# Patient Record
Sex: Female | Born: 1935 | Race: White | Hispanic: No | Marital: Single | Smoking: Never smoker
Health system: Southern US, Community
[De-identification: ages and names within clinical notes are randomized; demographics above are authoritative.]

## PROBLEM LIST (undated history)

## (undated) DIAGNOSIS — E119 Type 2 diabetes mellitus without complications: Secondary | ICD-10-CM

## (undated) DIAGNOSIS — I509 Heart failure, unspecified: Secondary | ICD-10-CM

## (undated) DIAGNOSIS — K429 Umbilical hernia without obstruction or gangrene: Secondary | ICD-10-CM

## (undated) DIAGNOSIS — C50919 Malignant neoplasm of unspecified site of unspecified female breast: Secondary | ICD-10-CM

## (undated) DIAGNOSIS — C9000 Multiple myeloma not having achieved remission: Secondary | ICD-10-CM

## (undated) DIAGNOSIS — I10 Essential (primary) hypertension: Secondary | ICD-10-CM

## (undated) DIAGNOSIS — C801 Malignant (primary) neoplasm, unspecified: Secondary | ICD-10-CM

## (undated) DIAGNOSIS — I4891 Unspecified atrial fibrillation: Secondary | ICD-10-CM

---

## 1998-08-30 HISTORY — PX: OTHER SURGICAL HISTORY: SHX169

## 2014-06-13 ENCOUNTER — Emergency Department: Payer: Self-pay | Admitting: Emergency Medicine

## 2014-09-13 ENCOUNTER — Emergency Department: Payer: Self-pay | Admitting: Emergency Medicine

## 2014-09-13 LAB — URINALYSIS, COMPLETE
BILIRUBIN, UR: NEGATIVE
BLOOD: NEGATIVE
Bacteria: NONE SEEN
GLUCOSE, UR: NEGATIVE mg/dL (ref 0–75)
Leukocyte Esterase: NEGATIVE
Nitrite: NEGATIVE
PH: 7 (ref 4.5–8.0)
Protein: NEGATIVE
RBC,UR: <1 /HPF (ref 0–5)
SPECIFIC GRAVITY: 1.011 (ref 1.003–1.030)
SQUAMOUS EPITHELIAL: NONE SEEN
WBC UR: NONE SEEN /HPF (ref 0–5)

## 2014-09-13 LAB — COMPREHENSIVE METABOLIC PANEL
ALT: 20 U/L
Albumin: 3.5 g/dL (ref 3.4–5.0)
Alkaline Phosphatase: 133 U/L — ABNORMAL HIGH
Anion Gap: 6 — ABNORMAL LOW (ref 7–16)
BUN: 8 mg/dL (ref 7–18)
Bilirubin,Total: 0.6 mg/dL (ref 0.2–1.0)
CO2: 28 mmol/L (ref 21–32)
Calcium, Total: 8.7 mg/dL (ref 8.5–10.1)
Chloride: 102 mmol/L (ref 98–107)
Creatinine: 0.55 mg/dL — ABNORMAL LOW (ref 0.60–1.30)
EGFR (Non-African Amer.): >60
Glucose: 133 mg/dL — ABNORMAL HIGH (ref 65–99)
Osmolality: 272 (ref 275–301)
Potassium: 3.6 mmol/L (ref 3.5–5.1)
SGOT(AST): 27 U/L (ref 15–37)
Sodium: 136 mmol/L (ref 136–145)
TOTAL PROTEIN: 8.2 g/dL (ref 6.4–8.2)

## 2014-09-13 LAB — CBC WITH DIFFERENTIAL/PLATELET
BASOS PCT: 0.5 %
Basophil #: 0 10*3/uL (ref 0.0–0.1)
EOS ABS: 0.1 10*3/uL (ref 0.0–0.7)
Eosinophil %: 1.1 %
HCT: 36.5 % (ref 35.0–47.0)
HGB: 11.8 g/dL — ABNORMAL LOW (ref 12.0–16.0)
LYMPHS PCT: 16.6 %
Lymphocyte #: 1.1 10*3/uL (ref 1.0–3.6)
MCH: 32.2 pg (ref 26.0–34.0)
MCHC: 32.4 g/dL (ref 32.0–36.0)
MCV: 99 fL (ref 80–100)
MONO ABS: 0.7 x10 3/mm (ref 0.2–0.9)
Monocyte %: 10 %
Neutrophil #: 4.7 10*3/uL (ref 1.4–6.5)
Neutrophil %: 71.8 %
Platelet: 320 10*3/uL (ref 150–440)
RBC: 3.67 10*6/uL — ABNORMAL LOW (ref 3.80–5.20)
RDW: 16.8 % — ABNORMAL HIGH (ref 11.5–14.5)
WBC: 6.6 10*3/uL (ref 3.6–11.0)

## 2014-09-13 LAB — TROPONIN I: Troponin-I: <0.02 ng/mL

## 2014-09-18 LAB — CULTURE, BLOOD (SINGLE)

## 2016-09-03 ENCOUNTER — Other Ambulatory Visit: Payer: Self-pay | Admitting: Physician Assistant

## 2016-09-03 ENCOUNTER — Ambulatory Visit
Admission: RE | Admit: 2016-09-03 | Discharge: 2016-09-03 | Disposition: A | Discharge status: Home / Self Care | Payer: Medicare Other | Source: Ambulatory Visit | Attending: Physician Assistant | Admitting: Physician Assistant

## 2016-09-03 DIAGNOSIS — K420 Umbilical hernia with obstruction, without gangrene: Secondary | ICD-10-CM

## 2016-09-03 DIAGNOSIS — K429 Umbilical hernia without obstruction or gangrene: Secondary | ICD-10-CM | POA: Diagnosis not present

## 2016-09-03 DIAGNOSIS — I2510 Atherosclerotic heart disease of native coronary artery without angina pectoris: Secondary | ICD-10-CM | POA: Insufficient documentation

## 2016-09-03 DIAGNOSIS — I700 Atherosclerosis of aorta: Secondary | ICD-10-CM | POA: Diagnosis not present

## 2016-09-03 DIAGNOSIS — K5730 Diverticulosis of large intestine without perforation or abscess without bleeding: Secondary | ICD-10-CM | POA: Diagnosis not present

## 2016-09-03 HISTORY — DX: Essential (primary) hypertension: I10

## 2016-09-03 HISTORY — DX: Type 2 diabetes mellitus without complications: E11.9

## 2016-09-03 HISTORY — DX: Malignant (primary) neoplasm, unspecified: C80.1

## 2016-09-03 LAB — POCT I-STAT CREATININE: CREATININE: 0.6 mg/dL (ref 0.44–1.00)

## 2016-09-03 MED ORDER — IOPAMIDOL (ISOVUE-300) INJECTION 61%
100.0000 mL | Freq: Once | INTRAVENOUS | Status: AC | PRN
  Administered 2016-09-03: 100 mL via INTRAVENOUS

## 2016-11-08 ENCOUNTER — Emergency Department: Payer: Medicare Other

## 2016-11-08 ENCOUNTER — Encounter: Payer: Self-pay | Admitting: *Deleted

## 2016-11-08 ENCOUNTER — Emergency Department
Admission: EM | Admit: 2016-11-08 | Discharge: 2016-11-08 | Disposition: A | Discharge status: Home / Self Care | Payer: Medicare Other | Attending: Emergency Medicine | Admitting: Emergency Medicine

## 2016-11-08 DIAGNOSIS — R8281 Pyuria: Secondary | ICD-10-CM

## 2016-11-08 DIAGNOSIS — R42 Dizziness and giddiness: Secondary | ICD-10-CM

## 2016-11-08 DIAGNOSIS — I10 Essential (primary) hypertension: Secondary | ICD-10-CM | POA: Diagnosis not present

## 2016-11-08 DIAGNOSIS — E119 Type 2 diabetes mellitus without complications: Secondary | ICD-10-CM | POA: Insufficient documentation

## 2016-11-08 DIAGNOSIS — Z79899 Other long term (current) drug therapy: Secondary | ICD-10-CM | POA: Diagnosis not present

## 2016-11-08 DIAGNOSIS — R0602 Shortness of breath: Secondary | ICD-10-CM | POA: Insufficient documentation

## 2016-11-08 DIAGNOSIS — N390 Urinary tract infection, site not specified: Secondary | ICD-10-CM | POA: Diagnosis not present

## 2016-11-08 DIAGNOSIS — Z7984 Long term (current) use of oral hypoglycemic drugs: Secondary | ICD-10-CM | POA: Insufficient documentation

## 2016-11-08 HISTORY — DX: Unspecified atrial fibrillation: I48.91

## 2016-11-08 LAB — CBC WITH DIFFERENTIAL/PLATELET
BASOS ABS: 0 10*3/uL (ref 0–0.1)
Basophils Relative: 1 %
EOS PCT: 1 %
Eosinophils Absolute: 0.1 10*3/uL (ref 0–0.7)
HEMATOCRIT: 33.9 % — AB (ref 35.0–47.0)
HEMOGLOBIN: 11.3 g/dL — AB (ref 12.0–16.0)
LYMPHS PCT: 17 %
Lymphs Abs: 0.9 10*3/uL — ABNORMAL LOW (ref 1.0–3.6)
MCH: 33.3 pg (ref 26.0–34.0)
MCHC: 33.3 g/dL (ref 32.0–36.0)
MCV: 100.1 fL — AB (ref 80.0–100.0)
Monocytes Absolute: 0.5 10*3/uL (ref 0.2–0.9)
Monocytes Relative: 10 %
NEUTROS ABS: 3.7 10*3/uL (ref 1.4–6.5)
Neutrophils Relative %: 71 %
Platelets: 232 10*3/uL (ref 150–440)
RBC: 3.39 MIL/uL — AB (ref 3.80–5.20)
RDW: 16.9 % — ABNORMAL HIGH (ref 11.5–14.5)
WBC: 5.2 10*3/uL (ref 3.6–11.0)

## 2016-11-08 LAB — URINALYSIS, COMPLETE (UACMP) WITH MICROSCOPIC
BILIRUBIN URINE: NEGATIVE
Glucose, UA: NEGATIVE mg/dL
HGB URINE DIPSTICK: NEGATIVE
Ketones, ur: NEGATIVE mg/dL
NITRITE: NEGATIVE
PROTEIN: NEGATIVE mg/dL
SPECIFIC GRAVITY, URINE: 1.017 (ref 1.005–1.030)
pH: 6 (ref 5.0–8.0)

## 2016-11-08 LAB — COMPREHENSIVE METABOLIC PANEL
ALK PHOS: 69 U/L (ref 38–126)
ALT: 13 U/L — AB (ref 14–54)
AST: 20 U/L (ref 15–41)
Albumin: 3.7 g/dL (ref 3.5–5.0)
Anion gap: 5 (ref 5–15)
BILIRUBIN TOTAL: 0.8 mg/dL (ref 0.3–1.2)
BUN: 17 mg/dL (ref 6–20)
CALCIUM: 8.9 mg/dL (ref 8.9–10.3)
CHLORIDE: 104 mmol/L (ref 101–111)
CO2: 29 mmol/L (ref 22–32)
CREATININE: 0.33 mg/dL — AB (ref 0.44–1.00)
Glucose, Bld: 147 mg/dL — ABNORMAL HIGH (ref 65–99)
Potassium: 3.8 mmol/L (ref 3.5–5.1)
Sodium: 138 mmol/L (ref 135–145)
Total Protein: 7.5 g/dL (ref 6.5–8.1)

## 2016-11-08 LAB — PROTIME-INR
INR: 1.23
PROTHROMBIN TIME: 15.6 s — AB (ref 11.4–15.2)

## 2016-11-08 LAB — TROPONIN I

## 2016-11-08 MED ORDER — CEPHALEXIN 500 MG PO CAPS
500.0000 mg | ORAL_CAPSULE | Freq: Once | ORAL | Status: AC
  Administered 2016-11-08: 500 mg via ORAL
  Filled 2016-11-08: qty 1

## 2016-11-08 MED ORDER — CEPHALEXIN 500 MG PO CAPS: 500.0000 mg | ORAL_CAPSULE | Freq: Three times a day (TID) | ORAL | 0 refills | Status: AC

## 2016-11-08 MED ORDER — SODIUM CHLORIDE 0.9 % IV BOLUS (SEPSIS)
1000.0000 mL | Freq: Once | INTRAVENOUS | Status: AC
  Administered 2016-11-08: 1000 mL via INTRAVENOUS

## 2016-11-08 NOTE — ED Provider Notes (Signed)
[facility] Emergency Department Provider Note  ____________________________________________   First MD Initiated Contact with Patient 11/08/16 [phone]     (approximate)  I have reviewed the triage vital signs and the nursing notes.   HISTORY  Chief Complaint Dizziness   HPI Cassie Huffman is a 83 y.o. female who comes to the emergency department via EMS for lightheadedness and a near syncopal episode today. The last 2-3 days she has felt more lightheaded in this morning she stood up to go to the bathroom and she felt off balance and fell to the ground onto her left side. She did not hit her head. She denies loss of consciousness. She denies chest pain or shortness of breath. She denies palpitations. She has a long-standing history of atrial fibrillation and last year she had a "small stroke". And is now currently anticoagulated with*all toe.   Past Medical History:  Diagnosis Date  . A-fib ([place])   . Cancer ([place])   . Diabetes mellitus without complication ([place])   . Hypertension     There are no active problems to display for this patient.   History reviewed. No pertinent surgical history.  Prior to Admission medications   Medication Sig Start Date End Date Taking? Authorizing Provider  diclofenac (FLECTOR) 1.3 % PTCH Place 1 patch onto the skin every 12 (twelve) hours as needed. 10/27/16 11/26/16 Yes Historical Provider, MD  cephALEXin (KEFLEX) 500 MG capsule Take 1 capsule (500 mg total) by mouth 3 (three) times daily. 11/08/16 11/13/16  Darel Hong, MD  metoprolol succinate (TOPROL-XL) 25 MG 24 hr tablet Take 25 mg by mouth daily. 10/26/16   Historical Provider, MD  WELCHOL 625 MG tablet Take 625 mg by mouth 2 (two) times daily. 10/05/16   Historical Provider, MD  XARELTO 15 MG TABS tablet Take 15 mg by mouth daily. 11/04/16   Historical Provider, MD    Allergies Patient has no known allergies.  No family history on file.  Social History Social  History  Substance Use Topics  . Smoking status: Never Smoker  . Smokeless tobacco: Never Used  . Alcohol use No    Review of Systems Constitutional: No fever/chills Eyes: No visual changes. ENT: No sore throat. Cardiovascular: Denies chest pain. Respiratory: Denies shortness of breath. Gastrointestinal: No abdominal pain.  No nausea, no vomiting.  No diarrhea.  No constipation. Genitourinary: Negative for dysuria. Musculoskeletal: Negative for back pain. Skin: Negative for rash. Neurological: Negative for headaches, focal weakness or numbness.  10-point ROS otherwise negative.  ____________________________________________   PHYSICAL EXAM:  VITAL SIGNS: ED Triage Vitals  Enc Vitals Group     BP 11/08/16 0743 (!) 153/92     Pulse Rate 11/08/16 0743 77     Resp 11/08/16 0743 18     Temp 11/08/16 0743 97.7 F (36.5 C)     Temp Source 11/08/16 0743 Oral     SpO2 11/08/16 0740 98 %     Weight 11/08/16 0743 196 lb (88.9 kg)     Height 11/08/16 0743 4\' 10"  (1.473 m)     Head Circumference --      Peak Flow --      Pain Score --      Pain Loc --      Pain Edu? --      Excl. in [place]? --     Constitutional: Alert and oriented x 4 well appearing nontoxic no diaphoresis speaks in full, clear sentences Eyes: PERRL EOMI.No nystagmus Head: Atraumatic.  Nose: No congestion/rhinnorhea. Mouth/Throat: No trismus Neck: No stridor.   Cardiovascular: Normal rate, regular rhythm. Grossly normal heart sounds.  Good peripheral circulation. Respiratory: Normal respiratory effort.  No retractions. Lungs CTAB and moving good air Gastrointestinal: Soft nondistended nontender no rebound no guarding no peritonitis no McBurney's tenderness negative Rovsing's no costovertebral tenderness negative Murphy's Musculoskeletal: No lower extremity edema   Neurologic: Cranial nerves II through XII intact No pronator drift 5 out of 5 biceps triceps plantar flexion dorsiflexion Abnormal  finger-nose-finger bilaterally Sensation intact to light touch throughout Skin:  Skin is warm, dry and intact. No rash noted. Psychiatric: Mood and affect are normal. Speech and behavior are normal.  ____________________________________________   LABS (all labs ordered are listed, but only abnormal results are displayed)  Labs Reviewed  CBC WITH DIFFERENTIAL/PLATELET - Abnormal; Notable for the following:       Result Value   RBC 3.39 (*)    Hemoglobin 11.3 (*)    HCT 33.9 (*)    MCV 100.1 (*)    RDW 16.9 (*)    Lymphs Abs 0.9 (*)    All other components within normal limits  COMPREHENSIVE METABOLIC PANEL - Abnormal; Notable for the following:    Glucose, Bld 147 (*)    Creatinine, Ser 0.33 (*)    ALT 13 (*)    All other components within normal limits  PROTIME-INR - Abnormal; Notable for the following:    Prothrombin Time 15.6 (*)    All other components within normal limits  URINALYSIS, COMPLETE (UACMP) WITH MICROSCOPIC - Abnormal; Notable for the following:    Color, Urine YELLOW (*)    APPearance CLEAR (*)    Leukocytes, UA SMALL (*)    Bacteria, UA RARE (*)    Squamous Epithelial / LPF 0-5 (*)    All other components within normal limits  URINE CULTURE  TROPONIN I   ____________________________________________  EKG  ED ECG REPORT I, Darel Hong, the attending physician, personally viewed and interpreted this ECG.  Date: 11/08/2016 Atrial fibrillation at 21 with premature ventricular contraction prolonged QTC and nonspecific ST changes wavy baseline rightward axis and no ST elevation abnormal EKG ________________________________________  RADIOLOGY  Chest x-ray with no acute disease and head CT with no acute disease ____________________________________________   PROCEDURES  Procedure(s) performed: no  Procedures  Critical Care performed: no  ____________________________________________   INITIAL IMPRESSION / ASSESSMENT AND PLAN / ED  COURSE  Pertinent labs & imaging results that were available during my care of the patient were reviewed by me and considered in my medical decision making (see chart for details).  The patient arrives well-appearing with no signs of trauma and an essentially normal exam aside from some abnormal Cerebellar coordination. Head CT is negative for acute pathology but does show remote stroke in the distribution that would cause this cerebellar dysfunction. Her urinalysis shows some pyuria with bacteria which will not convincing for urinary tract infection may represent it. I will treat her with a short course of oral antibiotics and a three-day checkup with her primary care physician. She is discharged home in good condition in the custody of her daughter.   ____________________________________________   FINAL CLINICAL IMPRESSION(S) / ED DIAGNOSES  Final diagnoses:  Pyuria  Lightheaded      NEW MEDICATIONS STARTED DURING THIS VISIT:  New Prescriptions   CEPHALEXIN (KEFLEX) 500 MG CAPSULE    Take 1 capsule (500 mg total) by mouth 3 (three) times daily.     Note:  This document was prepared using Dragon voice recognition software and may include unintentional dictation errors.     Darel Hong, MD 11/08/16 [phone]

## 2016-11-08 NOTE — ED Triage Notes (Signed)
Pt arrives via EMS from home, pt woke up feeling dizzy and then fell, denies LOC or hitting or head, pt on xarelto with hx of afib, in route pt began to complain of neck pain, EMS placed towel rolls for comfort, pt awake and alert upon arrival

## 2016-11-08 NOTE — Discharge Instructions (Signed)
Please follow-up with your primary care physician in 3 days for recheck. Return to the emergency department sooner for any new or worsening symptoms such as fevers, chills, worsening pain, or for any other concerns.  It was a pleasure to take care of you today, and thank you for coming to our emergency department.  If you have any questions or concerns before leaving please ask the nurse to grab me and I'm more than happy to go through your aftercare instructions again.  If you were prescribed any opioid pain medication today such as Norco, Vicodin, Percocet, morphine, hydrocodone, or oxycodone please make sure you do not drive when you are taking this medication as it can alter your ability to drive safely.  If you have any concerns once you are home that you are not improving or are in fact getting worse before you can make it to your follow-up appointment, please do not hesitate to call 911 and come back for further evaluation.  Darel Hong MD  Results for orders placed or performed during the hospital encounter of 11/08/16  CBC with Differential  Result Value Ref Range   WBC 5.2 3.6 - 11.0 K/uL   RBC 3.39 (L) 3.80 - 5.20 MIL/uL   Hemoglobin 11.3 (L) 12.0 - 16.0 g/dL   HCT 33.9 (L) 35.0 - 47.0 %   MCV 100.1 (H) 80.0 - 100.0 fL   MCH 33.3 26.0 - 34.0 pg   MCHC 33.3 32.0 - 36.0 g/dL   RDW 16.9 (H) 11.5 - 14.5 %   Platelets 232 150 - 440 K/uL   Neutrophils Relative % 71 %   Neutro Abs 3.7 1.4 - 6.5 K/uL   Lymphocytes Relative 17 %   Lymphs Abs 0.9 (L) 1.0 - 3.6 K/uL   Monocytes Relative 10 %   Monocytes Absolute 0.5 0.2 - 0.9 K/uL   Eosinophils Relative 1 %   Eosinophils Absolute 0.1 0 - 0.7 K/uL   Basophils Relative 1 %   Basophils Absolute 0.0 0 - 0.1 K/uL  Comprehensive metabolic panel  Result Value Ref Range   Sodium 138 135 - 145 mmol/L   Potassium 3.8 3.5 - 5.1 mmol/L   Chloride 104 101 - 111 mmol/L   CO2 29 22 - 32 mmol/L   Glucose, Bld 147 (H) 65 - 99 mg/dL   BUN 17  6 - 20 mg/dL   Creatinine, Ser 0.33 (L) 0.44 - 1.00 mg/dL   Calcium 8.9 8.9 - 10.3 mg/dL   Total Protein 7.5 6.5 - 8.1 g/dL   Albumin 3.7 3.5 - 5.0 g/dL   AST 20 15 - 41 U/L   ALT 13 (L) 14 - 54 U/L   Alkaline Phosphatase 69 38 - 126 U/L   Total Bilirubin 0.8 0.3 - 1.2 mg/dL   GFR calc non Af Amer >60 >60 mL/min   GFR calc Af Amer >60 >60 mL/min   Anion gap 5 5 - 15  Troponin I  Result Value Ref Range   Troponin I <0.03 <0.03 ng/mL  Protime-INR  Result Value Ref Range   Prothrombin Time 15.6 (H) 11.4 - 15.2 seconds   INR 1.23   Urinalysis, Complete w Microscopic  Result Value Ref Range   Color, Urine YELLOW (A) YELLOW   APPearance CLEAR (A) CLEAR   Specific Gravity, Urine 1.017 1.005 - 1.030   pH 6.0 5.0 - 8.0   Glucose, UA NEGATIVE NEGATIVE mg/dL   Hgb urine dipstick NEGATIVE NEGATIVE   Bilirubin Urine NEGATIVE  NEGATIVE   Ketones, ur NEGATIVE NEGATIVE mg/dL   Protein, ur NEGATIVE NEGATIVE mg/dL   Nitrite NEGATIVE NEGATIVE   Leukocytes, UA SMALL (A) NEGATIVE   RBC / HPF 0-5 0 - 5 RBC/hpf   WBC, UA 6-30 0 - 5 WBC/hpf   Bacteria, UA RARE (A) NONE SEEN   Squamous Epithelial / LPF 0-5 (A) NONE SEEN   Mucous PRESENT    Dg Chest 1 View  Result Date: 11/08/2016 CLINICAL DATA:  Cough and shortness of breath EXAM: CHEST 1 VIEW COMPARISON:  None. FINDINGS: There is moderate cardiac enlargement. No pleural effusion or edema. Mild chronic coarsened interstitial markings noted. No superimposed airspace consolidation. The bones are diffusely osteopenic. Chronic healed right proximal humerus fracture noted. IMPRESSION: 1. No acute cardiopulmonary abnormalities. Electronically Signed   By: Kerby Moors M.D.   On: 11/08/2016 08:21   Ct Head Wo Contrast  Result Date: 11/08/2016 CLINICAL DATA:  Dizziness and fall EXAM: CT HEAD WITHOUT CONTRAST TECHNIQUE: Contiguous axial images were obtained from the base of the skull through the vertex without intravenous contrast. COMPARISON:  None.  FINDINGS: Brain: There is mild diffuse atrophy. There is no intracranial mass, hemorrhage, extra-axial fluid collection, or midline shift. There is evidence of a prior small infarct in the inferior anterior right centrum semiovale. There is patchy small vessel disease in the centra semiovale bilaterally. Elsewhere gray-white compartments appear normal. No acute infarct is demonstrable. Vascular: No hyperdense vessels are evident. There is calcification in each carotid siphon region as well as in the distal left vertebral artery. Skull: Bones are somewhat osteoporotic but intact. Sinuses/Orbits: There is mucosal thickening in several ethmoid air cells bilaterally. Other visualized paranasal sinuses are clear. Orbits appear symmetric bilaterally. Other: There is opacification of several inferior mastoid air cells bilaterally. IMPRESSION: Mild atrophy with patchy periventricular small vessel disease. Prior small lacunar infarct in the inferior most aspect of the right centrum semiovale. No acute infarct evident. No mass, hemorrhage, or extra-axial fluid collection. Bones osteoporotic. Foci of arterial vascular calcification noted. Ethmoid sinus mucosal thickening noted at several sites bilaterally. There is opacification of several mastoid air cells bilaterally. Electronically Signed   By: Lowella Grip III M.D.   On: 11/08/2016 08:30

## 2016-11-09 LAB — URINE CULTURE

## 2016-11-12 ENCOUNTER — Other Ambulatory Visit: Payer: Self-pay | Admitting: Internal Medicine

## 2016-11-12 DIAGNOSIS — R270 Ataxia, unspecified: Secondary | ICD-10-CM

## 2016-11-25 ENCOUNTER — Ambulatory Visit: Payer: Medicare Other

## 2016-12-07 ENCOUNTER — Ambulatory Visit
Admission: RE | Admit: 2016-12-07 | Discharge: 2016-12-07 | Disposition: A | Discharge status: Home / Self Care | Payer: Medicare Other | Source: Ambulatory Visit | Attending: Internal Medicine | Admitting: Internal Medicine

## 2016-12-07 DIAGNOSIS — I6782 Cerebral ischemia: Secondary | ICD-10-CM | POA: Insufficient documentation

## 2016-12-07 DIAGNOSIS — G319 Degenerative disease of nervous system, unspecified: Secondary | ICD-10-CM | POA: Insufficient documentation

## 2016-12-07 DIAGNOSIS — R270 Ataxia, unspecified: Secondary | ICD-10-CM | POA: Insufficient documentation

## 2016-12-07 MED ORDER — GADOBENATE DIMEGLUMINE 529 MG/ML IV SOLN
15.0000 mL | Freq: Once | INTRAVENOUS | Status: AC | PRN
  Administered 2016-12-07: 15 mL via INTRAVENOUS

## 2018-06-23 ENCOUNTER — Emergency Department: Payer: Medicare Other

## 2018-06-23 ENCOUNTER — Emergency Department
Admission: EM | Admit: 2018-06-23 | Discharge: 2018-06-23 | Disposition: A | Discharge status: Home / Self Care | Payer: Medicare Other | Attending: Emergency Medicine | Admitting: Emergency Medicine

## 2018-06-23 ENCOUNTER — Other Ambulatory Visit: Payer: Self-pay

## 2018-06-23 ENCOUNTER — Encounter: Payer: Self-pay | Admitting: Emergency Medicine

## 2018-06-23 DIAGNOSIS — Z7901 Long term (current) use of anticoagulants: Secondary | ICD-10-CM | POA: Diagnosis not present

## 2018-06-23 DIAGNOSIS — S0990XA Unspecified injury of head, initial encounter: Secondary | ICD-10-CM | POA: Insufficient documentation

## 2018-06-23 DIAGNOSIS — E119 Type 2 diabetes mellitus without complications: Secondary | ICD-10-CM | POA: Insufficient documentation

## 2018-06-23 DIAGNOSIS — W19XXXA Unspecified fall, initial encounter: Secondary | ICD-10-CM

## 2018-06-23 DIAGNOSIS — Z79899 Other long term (current) drug therapy: Secondary | ICD-10-CM | POA: Insufficient documentation

## 2018-06-23 DIAGNOSIS — Y92018 Other place in single-family (private) house as the place of occurrence of the external cause: Secondary | ICD-10-CM | POA: Diagnosis not present

## 2018-06-23 DIAGNOSIS — Y9389 Activity, other specified: Secondary | ICD-10-CM | POA: Insufficient documentation

## 2018-06-23 DIAGNOSIS — I10 Essential (primary) hypertension: Secondary | ICD-10-CM | POA: Diagnosis not present

## 2018-06-23 DIAGNOSIS — Y999 Unspecified external cause status: Secondary | ICD-10-CM | POA: Diagnosis not present

## 2018-06-23 DIAGNOSIS — W01198A Fall on same level from slipping, tripping and stumbling with subsequent striking against other object, initial encounter: Secondary | ICD-10-CM | POA: Insufficient documentation

## 2018-06-23 LAB — GLUCOSE, CAPILLARY: Glucose-Capillary: 147 mg/dL — ABNORMAL HIGH (ref 70–99)

## 2018-06-23 NOTE — ED Triage Notes (Addendum)
Pt arrived via POV with daughter, pt states she tripped over the wheel of her walker today and hit the back of her head on the wall.  Pt denies any LOC.  PT states "I just whacked it good".  Pt denies any pain at this time.   Pt lives with daughters.  Pt currently taking Xarelto for a-fib.

## 2018-06-23 NOTE — ED Provider Notes (Signed)
[facility] Emergency Department Provider Note   ____________________________________________   First MD Initiated Contact with Patient 06/23/18 1238     (approximate)  I have reviewed the triage vital signs and the nursing notes.   HISTORY  Chief Complaint Hit head during fall   HPI Cassie Huffman is a 84 y.o. female presents for evaluation after a fall.  Patient reports he is a walker, she started to use the walker and she trips occasionally.  She tripped and fell backward towards her buttock but landed striking the back of her head against the wall.  She reports she felt a little bit of a headache but took a Tylenol and the headaches better, but because she is on Xarelto she wants to make sure she did not have any bleeding  No fevers or chills.  No chest pain or trouble breathing.  She is been in good health, and saw her doctor for some shortness of breath a couple of days ago, but she and her daughter report this is improved.  She had lab work done and a chest x-ray that her doctor has been following up on.  She reports that that is not bothering her and she just wants to make sure he should have any bleeding after she struck her head   Past Medical History:  Diagnosis Date  . A-fib ([place])   . Cancer ([place])   . Diabetes mellitus without complication ([place])   . Hypertension     There are no active problems to display for this patient.   History reviewed. No pertinent surgical history.  Prior to Admission medications   Medication Sig Start Date End Date Taking? Authorizing Provider  metoprolol succinate (TOPROL-XL) 25 MG 24 hr tablet Take 25 mg by mouth daily. 10/26/16   [provider]  WELCHOL 625 MG tablet Take 625 mg by mouth 2 (two) times daily. 10/05/16   [provider]  XARELTO 15 MG TABS tablet Take 15 mg by mouth daily. 11/04/16   [provider]    Allergies Tape; Pseudoephedrine; Bacitracin-polymyxin b; and  Procaine  No family history on file.  Social History Social History   Tobacco Use  . Smoking status: Never Smoker  . Smokeless tobacco: Never Used  Substance Use Topics  . Alcohol use: No  . Drug use: Not on file    Review of Systems Constitutional: No fever/chills.  Reports she definitely just tripped while using her walker. Eyes: No visual changes. ENT: No sore throat. Cardiovascular: Denies chest pain.  No palpitations.  No weakness.  Denies that she passed out or felt she was going to pass out. Respiratory: Denies shortness of breath. Gastrointestinal: No abdominal pain.   Genitourinary: Negative for dysuria. Musculoskeletal: Negative for back pain.  Did not hurt her arms or legs.  No back or hip pain.  Able to walk since. Skin: Negative for rash. Neurological: Negative for headaches except for a slight headache over the back of her scalp is better after Tylenol, areas of focal weakness or numbness.    ____________________________________________   PHYSICAL EXAM:  VITAL SIGNS: ED Triage Vitals  Enc Vitals Group     BP 06/23/18 1204 (!) 125/48     Pulse Rate 06/23/18 1204 73     Resp 06/23/18 1204 16     Temp 06/23/18 1204 97.7 F (36.5 C)     Temp Source 06/23/18 1204 Oral     SpO2 06/23/18 1204 94 %  Weight 06/23/18 1211 186 lb (84.4 kg)     Height 06/23/18 1211 4\' 9"  (1.448 m)     Head Circumference --      Peak Flow --      Pain Score 06/23/18 1204 0     Pain Loc --      Pain Edu? --      Excl. in [place]? --     Constitutional: Alert and oriented. Well appearing and in no acute distress.  She and her daughter both very pleasant. Eyes: Conjunctivae are normal. Head: Atraumatic.  He does report a little bit of tenderness over the posterior occiput.  No cervical tenderness.  Full range of motion the neck without pain or discomfort. Nose: No congestion/rhinnorhea. Mouth/Throat: Mucous membranes are moist. Neck: No stridor.  Cardiovascular: Normal rate,  just slightly irregular rhythm. Grossly normal heart sounds.  Good peripheral circulation. Respiratory: Normal respiratory effort.  No retractions. Lungs CTAB. Gastrointestinal: Soft and nontender. No distention. Musculoskeletal: No lower extremity tenderness nor edema. Neurologic:  Normal speech and language. No gross focal neurologic deficits are appreciated.  Skin:  Skin is warm, dry and intact. No rash noted. Psychiatric: Mood and affect are normal. Speech and behavior are normal.  ____________________________________________   LABS (all labs ordered are listed, but only abnormal results are displayed)  Labs Reviewed  GLUCOSE, CAPILLARY - Abnormal; Notable for the following components:      Result Value   Glucose-Capillary 147 (*)    All other components within normal limits  CBG MONITORING, ED   ____________________________________________  EKG Reviewed enterotomy at 1252 Heart rate 75 QRS 130 QTc 490 Atrial fibrillation, incomplete left bundle branch block appearance.  No evidence of acute ischemia  ____________________________________________  RADIOLOGY    CT result reviewed negative for acute ____________________________________________   PROCEDURES  Procedure(s) performed: None  Procedures  Critical Care performed: No  ____________________________________________   INITIAL IMPRESSION / ASSESSMENT AND PLAN / ED COURSE  Pertinent labs & imaging results that were available during my care of the patient were reviewed by me and considered in my medical decision making (see chart for details).   Patient presents for head injury.  Reports a mechanical fall while using her walker striking the back of her head against a wall.  No notable evidence of major trauma.  She is on Xarelto so CT imaging was performed which does not demonstrate intracranial hemorrhage or trauma.  Nexus negative for need of imaging of the neck.  She denies any associated systemic  symptoms no cardiac pulmonary vascular symptoms.  Saw PCP just a few days ago for some slight shortness of breath which is now improved, her lab work from the PCP visit was reviewed.  Chest x-ray result was not available, but she reports primary care doctor called yesterday and is following up on result.  She does not demonstrate any evidence of increased work of breathing or hypoxia.  EKG reassuring, demonstrate A. Fib.  ----------------------------------------- 1:11 PM on 06/23/2018 -----------------------------------------  Return precautions and treatment recommendations and follow-up discussed with the patient who is agreeable with the plan.       ____________________________________________   FINAL CLINICAL IMPRESSION(S) / ED DIAGNOSES  Final diagnoses:  Fall, initial encounter  Closed head injury, initial encounter        Note:  This document was prepared using Dragon voice recognition software and may include unintentional dictation errors       Delman Kitten, MD 06/23/18 1311

## 2018-06-23 NOTE — ED Notes (Signed)
Discussed with Dr. Cherylann Banas, new order received for CT of the head, no other labs or orders given at this time.

## 2018-10-03 ENCOUNTER — Emergency Department (HOSPITAL_COMMUNITY): Payer: Medicare Other

## 2018-10-03 ENCOUNTER — Encounter (HOSPITAL_COMMUNITY): Payer: Self-pay | Admitting: Emergency Medicine

## 2018-10-03 ENCOUNTER — Emergency Department (HOSPITAL_COMMUNITY)
Admission: EM | Admit: 2018-10-03 | Discharge: 2018-10-04 | Disposition: A | Discharge status: Home / Self Care | Payer: Medicare Other | Attending: Emergency Medicine | Admitting: Emergency Medicine

## 2018-10-03 DIAGNOSIS — I10 Essential (primary) hypertension: Secondary | ICD-10-CM | POA: Diagnosis not present

## 2018-10-03 DIAGNOSIS — Y999 Unspecified external cause status: Secondary | ICD-10-CM | POA: Diagnosis not present

## 2018-10-03 DIAGNOSIS — D649 Anemia, unspecified: Secondary | ICD-10-CM | POA: Diagnosis not present

## 2018-10-03 DIAGNOSIS — I4891 Unspecified atrial fibrillation: Secondary | ICD-10-CM | POA: Diagnosis not present

## 2018-10-03 DIAGNOSIS — Y9301 Activity, walking, marching and hiking: Secondary | ICD-10-CM | POA: Diagnosis not present

## 2018-10-03 DIAGNOSIS — W01198A Fall on same level from slipping, tripping and stumbling with subsequent striking against other object, initial encounter: Secondary | ICD-10-CM | POA: Insufficient documentation

## 2018-10-03 DIAGNOSIS — R51 Headache: Secondary | ICD-10-CM | POA: Diagnosis not present

## 2018-10-03 DIAGNOSIS — Z7901 Long term (current) use of anticoagulants: Secondary | ICD-10-CM | POA: Diagnosis not present

## 2018-10-03 DIAGNOSIS — E119 Type 2 diabetes mellitus without complications: Secondary | ICD-10-CM | POA: Diagnosis not present

## 2018-10-03 DIAGNOSIS — E876 Hypokalemia: Secondary | ICD-10-CM | POA: Diagnosis not present

## 2018-10-03 DIAGNOSIS — W19XXXA Unspecified fall, initial encounter: Secondary | ICD-10-CM

## 2018-10-03 DIAGNOSIS — Z043 Encounter for examination and observation following other accident: Secondary | ICD-10-CM | POA: Diagnosis present

## 2018-10-03 DIAGNOSIS — Y92009 Unspecified place in unspecified non-institutional (private) residence as the place of occurrence of the external cause: Secondary | ICD-10-CM | POA: Diagnosis not present

## 2018-10-03 DIAGNOSIS — E860 Dehydration: Secondary | ICD-10-CM | POA: Diagnosis not present

## 2018-10-03 LAB — CBC WITH DIFFERENTIAL/PLATELET
Abs Immature Granulocytes: 0.07 10*3/uL (ref 0.00–0.07)
Basophils Absolute: 0 10*3/uL (ref 0.0–0.1)
Basophils Relative: 0 %
EOS PCT: 0 %
Eosinophils Absolute: 0 10*3/uL (ref 0.0–0.5)
HCT: 27.9 % — ABNORMAL LOW (ref 36.0–46.0)
Hemoglobin: 8.5 g/dL — ABNORMAL LOW (ref 12.0–15.0)
Immature Granulocytes: 1 %
Lymphocytes Relative: 20 %
Lymphs Abs: 1.2 10*3/uL (ref 0.7–4.0)
MCH: 30.9 pg (ref 26.0–34.0)
MCHC: 30.5 g/dL (ref 30.0–36.0)
MCV: 101.5 fL — AB (ref 80.0–100.0)
MONO ABS: 0.5 10*3/uL (ref 0.1–1.0)
Monocytes Relative: 8 %
Neutro Abs: 4.3 10*3/uL (ref 1.7–7.7)
Neutrophils Relative %: 71 %
Platelets: 218 10*3/uL (ref 150–400)
RBC: 2.75 MIL/uL — ABNORMAL LOW (ref 3.87–5.11)
RDW: 19.1 % — ABNORMAL HIGH (ref 11.5–15.5)
WBC: 6.1 10*3/uL (ref 4.0–10.5)
nRBC: 0.3 % — ABNORMAL HIGH (ref 0.0–0.2)

## 2018-10-03 LAB — COMPREHENSIVE METABOLIC PANEL
ALT: 13 U/L (ref 0–44)
AST: 24 U/L (ref 15–41)
Albumin: 3.5 g/dL (ref 3.5–5.0)
Alkaline Phosphatase: 69 U/L (ref 38–126)
Anion gap: 13 (ref 5–15)
BUN: 29 mg/dL — ABNORMAL HIGH (ref 8–23)
CALCIUM: 9.3 mg/dL (ref 8.9–10.3)
CO2: 32 mmol/L (ref 22–32)
Chloride: 93 mmol/L — ABNORMAL LOW (ref 98–111)
Creatinine, Ser: 1.44 mg/dL — ABNORMAL HIGH (ref 0.44–1.00)
GFR calc Af Amer: 37 mL/min — ABNORMAL LOW (ref >60)
GFR, EST NON AFRICAN AMERICAN: 32 mL/min — AB (ref >60)
Glucose, Bld: 138 mg/dL — ABNORMAL HIGH (ref 70–99)
Potassium: 3.1 mmol/L — ABNORMAL LOW (ref 3.5–5.1)
Sodium: 138 mmol/L (ref 135–145)
Total Bilirubin: 1.2 mg/dL (ref 0.3–1.2)
Total Protein: 7.8 g/dL (ref 6.5–8.1)

## 2018-10-03 LAB — PROTIME-INR
INR: 1.38
Prothrombin Time: 16.8 seconds — ABNORMAL HIGH (ref 11.4–15.2)

## 2018-10-03 NOTE — ED Notes (Signed)
Patient transported to CT scan . 

## 2018-10-03 NOTE — ED Notes (Signed)
Pt gone to CT 

## 2018-10-03 NOTE — ED Triage Notes (Addendum)
Patient arrived with EMS from home lost her balance and fell this evening , no LOC , hit her head against the floor , pt. Is taking anticoagulant for her Afib , respirations unlabored , reports right temporal headache and mild posterior neck pain . CBG=170.

## 2018-10-03 NOTE — ED Provider Notes (Signed)
[facility] EMERGENCY DEPARTMENT Provider Note   CSN: [id number] Arrival date & time: 10/03/18  2214     History   Chief Complaint Chief Complaint  Patient presents with  . Fall    HPI Cassie Huffman is a 85 y.o. female.  85 y/o female with hx of DM, HTN, Afib on chronic Xarelto, prior TIA (per daughter) presents to the ED for evaluation after a fall.  Patient states that she was walking to get some water without the use of her walker which she often uses at baseline.  She next recalls being on the floor, unsure of how she got there.  She reports striking the right side of her head.  She has been experiencing some right-sided temporal pain as well as neck pain.  Denies any preceding chest pain or shortness of breath, lightheadedness.  Has no chest wall pain or abdominal pain since the fall.  No nausea or vomiting.  She has been compliant with her medications.  CBG was 170 with EMS.  The history is provided by the patient. No language interpreter was used.  Fall     Past Medical History:  Diagnosis Date  . A-fib ([place])   . Cancer ([place])   . Diabetes mellitus without complication ([place])   . Hypertension     There are no active problems to display for this patient.   History reviewed. No pertinent surgical history.   OB History   No obstetric history on file.      Home Medications    Prior to Admission medications   Medication Sig Start Date End Date Taking? Authorizing Provider  metoprolol succinate (TOPROL-XL) 25 MG 24 hr tablet Take 25 mg by mouth daily. 10/26/16   [provider]  WELCHOL 625 MG tablet Take 625 mg by mouth 2 (two) times daily. 10/05/16   [provider]  XARELTO 15 MG TABS tablet Take 15 mg by mouth daily. 11/04/16   [provider]    Family History No family history on file.  Social History Social History   Tobacco Use  . Smoking status: Never Smoker  . Smokeless tobacco: Never Used  Substance Use  Topics  . Alcohol use: No  . Drug use: Not on file     Allergies   Tape; Pseudoephedrine; Bacitracin-polymyxin b; and Procaine   Review of Systems Review of Systems Ten systems reviewed and are negative for acute change, except as noted in the HPI.    Physical Exam Updated Vital Signs BP (!) 105/37   Pulse 79   Temp 98.3 F (36.8 C) (Oral)   Resp 19   SpO2 95%   Physical Exam Vitals signs and nursing note reviewed.  Constitutional:      General: She is not in acute distress.    Appearance: She is well-developed. She is not diaphoretic.     Comments: Alert, pleasant.  HENT:     Head: Normocephalic and atraumatic.     Comments: No hematoma or contusion to scalp. No battle's sign or raccoon's eyes.    Ears:     Comments: No hemotympanum bilaterally Eyes:     General: No scleral icterus.    Conjunctiva/sclera: Conjunctivae normal.     Pupils: Pupils are equal, round, and reactive to light.  Neck:     Musculoskeletal: Normal range of motion.  Cardiovascular:     Rate and Rhythm: Normal rate and regular rhythm.     Pulses: Normal pulses.  Pulmonary:  Effort: Pulmonary effort is normal. No respiratory distress.     Breath sounds: No stridor. No wheezing.     Comments: Respirations even and unlabored. Lungs CTAB. Musculoskeletal: Normal range of motion.  Skin:    General: Skin is warm and dry.     Coloration: Skin is not pale.     Findings: No erythema or rash.  Neurological:     Mental Status: She is alert and oriented to person, place, and time.     Coordination: Coordination normal.     Comments: GCS 15. Speech is goal oriented. No cranial nerve deficits appreciated; symmetric eyebrow raise, no facial drooping, tongue midline. Patient has equal grip strength bilaterally with 5/5 strength against resistance in all major muscle groups bilaterally. Sensation to light touch intact. Patient moves extremities without ataxia.  Psychiatric:        Behavior: Behavior  normal.      ED Treatments / Results  Labs (all labs ordered are listed, but only abnormal results are displayed) Labs Reviewed  CBC WITH DIFFERENTIAL/PLATELET - Abnormal; Notable for the following components:      Result Value   RBC 2.75 (*)    Hemoglobin 8.5 (*)    HCT 27.9 (*)    MCV 101.5 (*)    RDW 19.1 (*)    nRBC 0.3 (*)    All other components within normal limits  COMPREHENSIVE METABOLIC PANEL - Abnormal; Notable for the following components:   Potassium 3.1 (*)    Chloride 93 (*)    Glucose, Bld 138 (*)    BUN 29 (*)    Creatinine, Ser 1.44 (*)    GFR calc non Af Amer 32 (*)    GFR calc Af Amer 37 (*)    All other components within normal limits  PROTIME-INR - Abnormal; Notable for the following components:   Prothrombin Time 16.8 (*)    All other components within normal limits  URINALYSIS, ROUTINE W REFLEX MICROSCOPIC - Abnormal; Notable for the following components:   APPearance HAZY (*)    All other components within normal limits  URINE CULTURE    EKG EKG Interpretation  Date/Time:  Tuesday October 03 2018 22:15:53 EST Ventricular Rate:  78 PR Interval:    QRS Duration: 140 QT Interval:  416 QTC Calculation: 474 R Axis:   -33 Text Interpretation:  Atrial fibrillation Ventricular premature complex Left bundle branch block Confirmed by Davonna Belling [phone]) on 10/03/2018 11:58:57 PM   Radiology Ct Head Wo Contrast  Result Date: 10/03/2018 CLINICAL DATA:  Patient lost balance and fell this evening. No loss of consciousness. Struck head against the floor. Anticoagulation is. Right temporal headache and posterior neck pain. EXAM: CT HEAD WITHOUT CONTRAST CT CERVICAL SPINE WITHOUT CONTRAST TECHNIQUE: Multidetector CT imaging of the head and cervical spine was performed following the standard protocol without intravenous contrast. Multiplanar CT image reconstructions of the cervical spine were also generated. COMPARISON:  CT head 06/23/2018. MRI brain  12/07/2016 FINDINGS: CT HEAD FINDINGS Brain: Diffuse cerebral atrophy. Ventricular dilatation consistent with central atrophy. Low-attenuation changes throughout the deep white matter consistent small vessel ischemia. Heterogeneous low-attenuation in the inferior cerebellar hemisphere on the left suggesting cerebellar infarcts, possibly acute. No mass effect or midline shift. No abnormal extra-axial fluid collections. Gray-white matter junctions are distinct. Basal cisterns are not effaced. No acute intracranial hemorrhage. Vascular: Moderate intracranial arterial vascular calcifications. Skull: Calvarium appears intact. No acute depressed skull fractures. Diffuse heterogeneous mottled appearance of the calvarium, similar to previous  study, likely due to metabolic bone disease. Sinuses/Orbits: Paranasal sinuses and mastoid air cells are clear. Other: None. CT CERVICAL SPINE FINDINGS Alignment: Normal alignment of the cervical vertebrae and facet joints. C1-2 articulation appears intact. Skull base and vertebrae: Skull base appears intact. No vertebral compression deformities. No focal bone lesion or bone destruction. Bone cortex appears intact. Soft tissues and spinal canal: No prevertebral soft tissue swelling. No abnormal paraspinal soft tissue mass or infiltration. Disc levels: Degenerative changes in the cervical spine with narrowed interspaces and endplate hypertrophic changes most prominent at C4-5, C5-6, C6-7, and C7-T1 levels. Upper chest: Lung apices are clear. Other: None. IMPRESSION: 1. No acute intracranial hemorrhage or mass effect. Chronic atrophy and small vessel ischemic changes. Heterogeneous low-attenuation in the inferior cerebellar hemisphere on the left suggesting cerebellar infarcts, possibly acute. 2. Normal alignment of the cervical spine. Degenerative changes. No acute displaced fractures identified. Electronically Signed   By: Lucienne Capers M.D.   On: 10/03/2018 23:03   Ct Cervical  Spine Wo Contrast  Result Date: 10/03/2018 CLINICAL DATA:  Patient lost balance and fell this evening. No loss of consciousness. Struck head against the floor. Anticoagulation is. Right temporal headache and posterior neck pain. EXAM: CT HEAD WITHOUT CONTRAST CT CERVICAL SPINE WITHOUT CONTRAST TECHNIQUE: Multidetector CT imaging of the head and cervical spine was performed following the standard protocol without intravenous contrast. Multiplanar CT image reconstructions of the cervical spine were also generated. COMPARISON:  CT head 06/23/2018. MRI brain 12/07/2016 FINDINGS: CT HEAD FINDINGS Brain: Diffuse cerebral atrophy. Ventricular dilatation consistent with central atrophy. Low-attenuation changes throughout the deep white matter consistent small vessel ischemia. Heterogeneous low-attenuation in the inferior cerebellar hemisphere on the left suggesting cerebellar infarcts, possibly acute. No mass effect or midline shift. No abnormal extra-axial fluid collections. Gray-white matter junctions are distinct. Basal cisterns are not effaced. No acute intracranial hemorrhage. Vascular: Moderate intracranial arterial vascular calcifications. Skull: Calvarium appears intact. No acute depressed skull fractures. 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 Sinuses/Orbits: Paranasal sinuses and mastoid air cells are clear. Other: None. CT CERVICAL SPINE FINDINGS Alignment: Normal alignment of the cervical vertebrae and facet joints. C1-2 articulation appears intact. Skull base and vertebrae: Skull base appears intact. No vertebral compression deformities. No focal bone lesion or bone destruction. Bone cortex appears intact. Soft tissues and spinal canal: No prevertebral soft tissue swelling. No abnormal paraspinal soft tissue mass or infiltration. Disc levels: Degenerative changes in the cervical spine with narrowed interspaces and endplate  hypertrophic changes most prominent at C4-5, C5-6, C6-7, and C7-T1 levels. Upper chest: Lung apices are clear. Other: None. IMPRESSION: 1. No acute intracranial hemorrhage or mass effect. Chronic atrophy and small vessel ischemic changes. Heterogeneous low-attenuation in the inferior cerebellar hemisphere on the left suggesting cerebellar infarcts, possibly acute. 2. Normal alignment of the cervical spine. Degenerative changes. No acute displaced fractures identified. Electronically Signed   By: Lucienne Capers M.D.   On: 10/03/2018 23:03   Mr Brain Wo Contrast  Result Date: 10/04/2018 CLINICAL DATA:  Follow-up examination for acute stroke. EXAM: MRI HEAD WITHOUT CONTRAST TECHNIQUE: Multiplanar, multiecho pulse sequences of the brain and surrounding structures were obtained without intravenous contrast. COMPARISON:  Prior CT from 10/03/2018 FINDINGS: Brain: Moderately advanced cerebral atrophy with chronic small vessel ischemic disease, mildly progressed relative to 2018. Chronic lacunar infarct involving the right basal ganglia with associated chronic hemosiderin staining. No abnormal foci of restricted diffusion to suggest acute or  subacute ischemia. Gray-white matter differentiation maintained. No other areas of remote cortical infarction. No foci of susceptibility artifact to suggest acute intracranial hemorrhage. No mass lesion, midline shift or mass effect. Mild diffuse ventricular prominence related to global parenchymal volume loss without hydrocephalus. Incidental note made of an empty sella, stable. Vascular: Abnormal flow void within the right ICA to the level of the terminus, likely occluded. This is stable from previous. Major intracranial vascular flow voids are otherwise maintained. Preserved but mildly attenuated flow voids noted within the right MCA distribution. Skull and upper cervical spine: Craniocervical junction normal. Upper cervical spine within normal limits. Bone marrow signal  intensity mildly heterogeneous without discrete osseous lesion. Sinuses/Orbits: Patient status post bilateral ocular lens replacement. Paranasal sinuses are clear. Small bilateral mastoid effusions, likely chronic. Inner ear structures grossly normal. Other: None. IMPRESSION: 1. No acute intracranial abnormality. 2. Moderately advanced cerebral atrophy with chronic small vessel ischemic disease, mildly progressed relative to 2018. 3. Abnormal flow void within the right ICA to the level of the terminus, likely occluded. Finding stable relative to 2018. Electronically Signed   By: Jeannine Boga M.D.   On: 10/04/2018 02:10    Procedures Procedures (including critical care time)  Medications Ordered in ED Medications  potassium chloride 20 MEQ/15ML (10%) solution 40 mEq (has no administration in time range)  sodium chloride 0.9 % bolus 1,000 mL (0 mLs Intravenous Stopped 10/04/18 0249)  acetaminophen (TYLENOL) tablet 1,000 mg (1,000 mg Oral Given 10/04/18 0108)    3:15 AM Chart reviewed including values in Care Everywhere. Hemoglobin has been steadily trending since 2017. Was 9.5 on 09/05/2018. Her creatinine is slightly elevated today (0.9 up to 1.44 today) suggestive of dehydration. Patient was given 1L of IVF while in the ED.  She has been ambulated in the emergency department, noted to be unsteady with the use of a walker.  Patient lives with her daughter who reports that she has had intermittent issues with balance with ambulation.  She has been recommended to use a wheelchair, but refuses.  Daughter states that there is currently plans for home health to come to the house for daily assistance with transitioning and ADLs.  The patient is also ordered to start physical therapy, per daughter; this was coordinated by the patient's PCP.  I have had a lengthy discussion with the patient's daughter about safety for discharge.  The patient's daughter feels comfortable taking the patient home and  assisting her with transitioning and ambulation.  I have explained to the patient that she is not able to be up and mobile without assistance.  Patient verbalizes understanding.  I have reiterated that she is an increased fall risk and this is increasingly problematic with her chronic use of anticoagulants.  Patient and daughter continue to express comfort with plan for discharge.   Initial Impression / Assessment and Plan / ED Course  I have reviewed the triage vital signs and the nursing notes.  Pertinent labs & imaging results that were available during my care of the patient were reviewed by me and considered in my medical decision making (see chart for details).     85 year old female presents to the ED following a fall at home.  She uses a walker when ambulating, but was using no assist device tonight.  Struck the right side of her head on the ground.  Denies LOC.  The patient is on chronic Xarelto for her rate controlled atrial fibrillation.  Her head CT did not show any evidence  of skull fracture, hemorrhage, traumatic hydrocephalus.  CT imaging was concerning for an acute cerebellar infarct.  For this reason, patient underwent emergent MRI to evaluate for an acute stroke.  Her MRI appears stable compared to 2018.  Question episodic weakness secondary to dehydration given slight elevation in the patient's creatinine today.  Her anemia is chronic and relatively stable.  I have discussed inpatient versus outpatient follow-up with the patient and her daughter.  Both express comfort with plan for discharge.  I have encouraged primary care follow-up by the end of the week regarding the patient's visit to the ED today.  Return precautions discussed and provided. Patient discharged in stable condition with no unaddressed concerns.   Vitals:   10/03/18 2300 10/03/18 2305 10/03/18 2345 10/04/18 0331  BP: (!) 135/37 (!) 129/51 (!) 105/37 (!) 114/45  Pulse: 68 64 79 68  Resp: (!) 21 16 19 16     Temp:      TempSrc:      SpO2: 100% 100% 95% 96%    Final Clinical Impressions(s) / ED Diagnoses   Final diagnoses:  Fall, initial encounter  Mild dehydration  Hypokalemia  Chronic anemia    ED Discharge Orders    None       Antonietta Breach, PA-C 10/04/18 0401    Davonna Belling, MD 10/04/18 1451

## 2018-10-04 ENCOUNTER — Emergency Department (HOSPITAL_COMMUNITY): Payer: Medicare Other

## 2018-10-04 DIAGNOSIS — R51 Headache: Secondary | ICD-10-CM | POA: Diagnosis not present

## 2018-10-04 LAB — URINALYSIS, ROUTINE W REFLEX MICROSCOPIC
Bilirubin Urine: NEGATIVE
Glucose, UA: NEGATIVE mg/dL
Hgb urine dipstick: NEGATIVE
Ketones, ur: NEGATIVE mg/dL
Leukocytes, UA: NEGATIVE
Nitrite: NEGATIVE
Protein, ur: NEGATIVE mg/dL
Specific Gravity, Urine: 1.013 (ref 1.005–1.030)
pH: 5 (ref 5.0–8.0)

## 2018-10-04 MED ORDER — ACETAMINOPHEN 500 MG PO TABS
1000.0000 mg | ORAL_TABLET | Freq: Once | ORAL | Status: AC
  Administered 2018-10-04: 1000 mg via ORAL
  Filled 2018-10-04: qty 2

## 2018-10-04 MED ORDER — POTASSIUM CHLORIDE 20 MEQ/15ML (10%) PO SOLN
40.0000 meq | Freq: Once | ORAL | Status: AC
  Administered 2018-10-04: 40 meq via ORAL
  Filled 2018-10-04: qty 30

## 2018-10-04 MED ORDER — SODIUM CHLORIDE 0.9 % IV BOLUS
1000.0000 mL | Freq: Once | INTRAVENOUS | Status: AC
  Administered 2018-10-04: 1000 mL via INTRAVENOUS

## 2018-10-04 NOTE — ED Notes (Signed)
Patient transported to MRI 

## 2018-10-05 LAB — URINE CULTURE: Culture: NO GROWTH

## 2018-10-06 ENCOUNTER — Emergency Department (HOSPITAL_COMMUNITY): Payer: Medicare Other

## 2018-10-06 ENCOUNTER — Other Ambulatory Visit: Payer: Self-pay

## 2018-10-06 ENCOUNTER — Encounter (HOSPITAL_COMMUNITY): Payer: Self-pay

## 2018-10-06 ENCOUNTER — Emergency Department (HOSPITAL_COMMUNITY)
Admission: EM | Admit: 2018-10-06 | Discharge: 2018-10-06 | Disposition: A | Discharge status: Home / Self Care | Payer: Medicare Other | Attending: Emergency Medicine | Admitting: Emergency Medicine

## 2018-10-06 DIAGNOSIS — Z859 Personal history of malignant neoplasm, unspecified: Secondary | ICD-10-CM | POA: Insufficient documentation

## 2018-10-06 DIAGNOSIS — M542 Cervicalgia: Secondary | ICD-10-CM | POA: Diagnosis not present

## 2018-10-06 DIAGNOSIS — I10 Essential (primary) hypertension: Secondary | ICD-10-CM | POA: Diagnosis not present

## 2018-10-06 DIAGNOSIS — E119 Type 2 diabetes mellitus without complications: Secondary | ICD-10-CM | POA: Diagnosis not present

## 2018-10-06 DIAGNOSIS — W19XXXA Unspecified fall, initial encounter: Secondary | ICD-10-CM

## 2018-10-06 DIAGNOSIS — R51 Headache: Secondary | ICD-10-CM | POA: Diagnosis not present

## 2018-10-06 DIAGNOSIS — Z79899 Other long term (current) drug therapy: Secondary | ICD-10-CM | POA: Insufficient documentation

## 2018-10-06 LAB — URINALYSIS, ROUTINE W REFLEX MICROSCOPIC
Bilirubin Urine: NEGATIVE
Glucose, UA: NEGATIVE mg/dL
Hgb urine dipstick: NEGATIVE
Ketones, ur: NEGATIVE mg/dL
Leukocytes, UA: NEGATIVE
NITRITE: NEGATIVE
PH: 6 (ref 5.0–8.0)
Protein, ur: NEGATIVE mg/dL
Specific Gravity, Urine: 1.009 (ref 1.005–1.030)

## 2018-10-06 LAB — CBC WITH DIFFERENTIAL/PLATELET
Abs Immature Granulocytes: 0.08 10*3/uL — ABNORMAL HIGH (ref 0.00–0.07)
Basophils Absolute: 0 10*3/uL (ref 0.0–0.1)
Basophils Relative: 0 %
Eosinophils Absolute: 0 10*3/uL (ref 0.0–0.5)
Eosinophils Relative: 0 %
HCT: 29.7 % — ABNORMAL LOW (ref 36.0–46.0)
HEMOGLOBIN: 8.9 g/dL — AB (ref 12.0–15.0)
Immature Granulocytes: 1 %
LYMPHS PCT: 13 %
Lymphs Abs: 0.8 10*3/uL (ref 0.7–4.0)
MCH: 30.8 pg (ref 26.0–34.0)
MCHC: 30 g/dL (ref 30.0–36.0)
MCV: 102.8 fL — AB (ref 80.0–100.0)
MONOS PCT: 7 %
Monocytes Absolute: 0.5 10*3/uL (ref 0.1–1.0)
Neutro Abs: 5 10*3/uL (ref 1.7–7.7)
Neutrophils Relative %: 79 %
Platelets: 220 10*3/uL (ref 150–400)
RBC: 2.89 MIL/uL — ABNORMAL LOW (ref 3.87–5.11)
RDW: 19.2 % — ABNORMAL HIGH (ref 11.5–15.5)
WBC: 6.4 10*3/uL (ref 4.0–10.5)
nRBC: 0.3 % — ABNORMAL HIGH (ref 0.0–0.2)

## 2018-10-06 LAB — COMPREHENSIVE METABOLIC PANEL
ALT: 16 U/L (ref 0–44)
AST: 24 U/L (ref 15–41)
Albumin: 3.6 g/dL (ref 3.5–5.0)
Alkaline Phosphatase: 70 U/L (ref 38–126)
Anion gap: 12 (ref 5–15)
BUN: 21 mg/dL (ref 8–23)
CALCIUM: 9.8 mg/dL (ref 8.9–10.3)
CO2: 29 mmol/L (ref 22–32)
Chloride: 100 mmol/L (ref 98–111)
Creatinine, Ser: 1.17 mg/dL — ABNORMAL HIGH (ref 0.44–1.00)
GFR calc Af Amer: 48 mL/min — ABNORMAL LOW (ref >60)
GFR, EST NON AFRICAN AMERICAN: 41 mL/min — AB (ref >60)
Glucose, Bld: 121 mg/dL — ABNORMAL HIGH (ref 70–99)
Potassium: 3.9 mmol/L (ref 3.5–5.1)
Sodium: 141 mmol/L (ref 135–145)
Total Bilirubin: 1.3 mg/dL — ABNORMAL HIGH (ref 0.3–1.2)
Total Protein: 7.9 g/dL (ref 6.5–8.1)

## 2018-10-06 LAB — I-STAT TROPONIN, ED: Troponin i, poc: 0.03 ng/mL (ref 0.00–0.08)

## 2018-10-06 LAB — CBG MONITORING, ED: Glucose-Capillary: 107 mg/dL — ABNORMAL HIGH (ref 70–99)

## 2018-10-06 LAB — MAGNESIUM: Magnesium: 1.2 mg/dL — ABNORMAL LOW (ref 1.7–2.4)

## 2018-10-06 NOTE — ED Notes (Signed)
Patient transported to CT 

## 2018-10-06 NOTE — ED Notes (Signed)
Patient verbalizes understanding of discharge instructions. Opportunity for questioning and answers were provided. Pt discharged from ED. 

## 2018-10-06 NOTE — ED Notes (Signed)
Attempted to obtain urine specimen; Pt unable to provide one at this time 

## 2018-10-06 NOTE — ED Provider Notes (Signed)
[facility] EMERGENCY DEPARTMENT Provider Note   CSN: [id number] Arrival date & time: 10/06/18  1449     History   Chief Complaint Chief Complaint  Patient presents with  . Fall  . Back Pain    HPI Cassie Huffman is a 85 y.o. female.  HPI   Cassie Huffman is a 85 y.o. female with PMH of atrial fibrillation on Xarelto, diabetes, hypertension who presents via EMS from home after an unwitnessed fall.  Patient reports that she was in the shower at home alone while her daughter was working when she became nauseous and then fell.  Hit the back of her head.  Did not get knocked out.  Denies chest pain or palpitations or presyncopal symptoms prior to falling over.  Has a mild headache at this time.  Was not able to stand up and walk on her own but was able to sit up and scoot across the shower.  Reports waited there until her daughter arrived.  Daughter found her sitting in the shower and acting normally.  She did not seem to be altered.  She also fell about 2 to 3 days ago and was evaluated in the emergency department.  Had been acting normally at her baseline since her discharge from the ED.  Eating and drinking normally with no fevers or chills.  No confusion.  No change in her interactions.  Performing the same amount of ADLs that she does at home and generally is able to care for herself with assistance.  Is acting at her baseline at this time per daughter although she is more agitated because of the cervical collar.  Took all of her medications this morning including her Xarelto.  Past Medical History:  Diagnosis Date  . A-fib ([place])   . Cancer ([place])   . Diabetes mellitus without complication ([place])   . Hypertension     There are no active problems to display for this patient.   History reviewed. No pertinent surgical history.   OB History   No obstetric history on file.      Home Medications    Prior to Admission medications   Medication Sig Start Date End  Date Taking? Authorizing Provider  cholestyramine (QUESTRAN) 4 g packet Take 4 g by mouth daily. With water 09/23/18  Yes [provider]  diclofenac sodium (VOLTAREN) 1 % GEL Apply 1 application topically as needed. 04/21/18  Yes [provider]  metFORMIN (GLUCOPHAGE) 500 MG tablet Take 500 mg by mouth daily. 09/30/18  Yes [provider]  metolazone (ZAROXOLYN) 2.5 MG tablet Take 2.5 mg by mouth daily as needed (feet swelling).  09/22/18 09/22/19 Yes [provider]  metoprolol succinate (TOPROL-XL) 25 MG 24 hr tablet Take 25 mg by mouth at bedtime.  10/26/16  Yes [provider]  Multiple Vitamins-Minerals (PRESERVISION AREDS 2) CAPS Take 1 capsule by mouth 2 (two) times daily.   Yes [provider]  potassium chloride (K-DUR) 10 MEQ tablet Take 10 mEq by mouth 2 (two) times daily. 09/07/18  Yes [provider]  torsemide (DEMADEX) 10 MG tablet Take 10 mg by mouth daily. 06/22/18 06/22/19 Yes [provider]  traMADol (ULTRAM) 50 MG tablet Take 50-100 mg by mouth every 6 (six) hours as needed for pain. 03/01/18  Yes [provider]  venlafaxine XR (EFFEXOR-XR) 37.5 MG 24 hr capsule Take 75 mg by mouth daily.  09/23/18  Yes [provider]  XARELTO 15 MG TABS tablet Take  15 mg by mouth daily. 11/04/16  Yes [provider]  MYRBETRIQ 25 MG TB24 tablet Take 25 mg by mouth daily. 09/30/18   [provider]    Family History No family history on file.  Social History Social History   Tobacco Use  . Smoking status: Never Smoker  . Smokeless tobacco: Never Used  Substance Use Topics  . Alcohol use: No  . Drug use: Not on file     Allergies   Tape; Pseudoephedrine; Bacitracin-polymyxin b; and Procaine   Review of Systems Review of Systems  Constitutional: Negative for chills and fever.  HENT: Negative for ear pain and sore throat.   Eyes: Negative for pain and visual disturbance.    Respiratory: Negative for cough and shortness of breath.   Cardiovascular: Negative for chest pain and palpitations.  Gastrointestinal: Positive for nausea. Negative for abdominal pain and vomiting.  Genitourinary: Negative for dysuria and hematuria.  Musculoskeletal: Positive for neck pain. Negative for arthralgias and back pain.  Skin: Negative for color change and rash.  Neurological: Positive for headaches. Negative for seizures and syncope.  Psychiatric/Behavioral: Positive for confusion (baseline).  All other systems reviewed and are negative.    Physical Exam Updated Vital Signs BP 122/60   Pulse 63   Temp 97.6 F (36.4 C) (Oral)   Resp 16   SpO2 100%   Physical Exam Vitals signs and nursing note reviewed.  Constitutional:      General: She is in acute distress (angry).     Appearance: Normal appearance. She is well-developed.     Interventions: Cervical collar in place.  HENT:     Head: Normocephalic and atraumatic.     Right Ear: Ear canal and external ear normal.     Left Ear: Ear canal and external ear normal.     Mouth/Throat:     Lips: Pink.     Mouth: Mucous membranes are dry.  Eyes:     General: Lids are normal. Vision grossly intact.     Conjunctiva/sclera: Conjunctivae normal.     Pupils: Pupils are equal, round, and reactive to light.     Comments: Visual acuity at baseline.  History of macular degeneration.  Neck:     Musculoskeletal: Neck supple. Spinous process tenderness and muscular tenderness present.     Comments: Patient is pain circumferentially around her neck in the area or cervical collar was in place by EMS.  Small erythematous indentation in the soft tissues to the neck at these locations of contact. Cardiovascular:     Rate and Rhythm: Normal rate. Rhythm irregularly irregular.     Heart sounds: S1 normal and S2 normal. No murmur.  Pulmonary:     Effort: Pulmonary effort is normal. No respiratory distress.     Breath sounds: Normal  breath sounds.  Abdominal:     Palpations: Abdomen is soft.     Tenderness: There is no abdominal tenderness. There is no guarding or rebound.  Musculoskeletal:     Right hip: Normal.     Left hip: Normal.     Thoracic back: Normal.     Lumbar back: Normal.  Skin:    General: Skin is warm and dry.  Neurological:     Mental Status: She is alert. Mental status is at baseline. She is disoriented.     GCS: GCS eye subscore is 4. GCS verbal subscore is 4. GCS motor subscore is 6.     Cranial Nerves: Cranial nerves are intact.  Sensory: Sensation is intact.     Motor: Motor function is intact.     Coordination: Coordination is intact.     Comments: No pronator drift.  Sensation equal bilaterally.  Strength 5 out of 5 in the bilateral upper and lower extremities.  Mild difficulty with point-to-point movements secondary to poor visual acuity at baseline in the setting of macular degeneration.  Knows her name and knows she is in the emergency department at a hospital but does not know which one.  Knows what led her to come here.  Thinks it is March and does not know the year.  Knows the president.  Knows her daughter at the bedside.  Psychiatric:        Behavior: Behavior is cooperative.      ED Treatments / Results  Labs (all labs ordered are listed, but only abnormal results are displayed) Labs Reviewed  CBC WITH DIFFERENTIAL/PLATELET - Abnormal; Notable for the following components:      Result Value   RBC 2.89 (*)    Hemoglobin 8.9 (*)    HCT 29.7 (*)    MCV 102.8 (*)    RDW 19.2 (*)    nRBC 0.3 (*)    Abs Immature Granulocytes 0.08 (*)    All other components within normal limits  COMPREHENSIVE METABOLIC PANEL - Abnormal; Notable for the following components:   Glucose, Bld 121 (*)    Creatinine, Ser 1.17 (*)    Total Bilirubin 1.3 (*)    GFR calc non Af Amer 41 (*)    GFR calc Af Amer 48 (*)    All other components within normal limits  MAGNESIUM - Abnormal; Notable  for the following components:   Magnesium 1.2 (*)    All other components within normal limits  URINALYSIS, ROUTINE W REFLEX MICROSCOPIC - Abnormal; Notable for the following components:   APPearance HAZY (*)    All other components within normal limits  CBG MONITORING, ED - Abnormal; Notable for the following components:   Glucose-Capillary 107 (*)    All other components within normal limits  I-STAT TROPONIN, ED    EKG EKG Interpretation  Date/Time:  Friday October 06 2018 15:01:19 EST Ventricular Rate:  88 PR Interval:    QRS Duration: 126 QT Interval:  492 QTC Calculation: 596 R Axis:   -26 Text Interpretation:  Atrial fibrillation Ventricular premature complex Probable left ventricular hypertrophy Nonspecific T abnormalities, lateral leads No significant change since last tracing Abnormal ekg Confirmed by Carmin Muskrat [phone]) on 10/06/2018 3:53:18 PM   Radiology Dg Chest 1 View  Result Date: 10/06/2018 CLINICAL DATA:  [place].  Dizziness. EXAM: CHEST  1 VIEW COMPARISON:  11/08/2016 FINDINGS: The heart is enlarged but stable. Mild tortuosity of the thoracic aorta. Mild chronic bronchitic type lung changes. Low lung volumes with vascular crowding and streaky basilar atelectasis. No definite infiltrates or effusions. The bony thorax is intact. Remote right humeral head and neck fractures are noted. IMPRESSION: 1. Stable cardiac enlargement. 2. Low lung volumes with vascular crowding and streaky basilar atelectasis but no infiltrates or effusions. Electronically Signed   By: Marijo Sanes M.D.   On: 10/06/2018 16:22   Dg Pelvis 1-2 Views  Result Date: 10/06/2018 CLINICAL DATA:  85 year old female status post syncope or presyncope and fall. EXAM: PELVIS - 1-2 VIEW COMPARISON:  CT Abdomen and Pelvis 09/03/2016. FINDINGS: Supine AP view at 1606 hours. Femoral heads are normally located. Grossly intact proximal femurs. The pelvis appears stable and intact. Negative  visible lower abdominal  and pelvic visceral contours. Aortoiliac calcified atherosclerosis. IMPRESSION: No acute fracture or dislocation identified about the pelvis. If there is lateralizing hip pain then a dedicated right hip series would be recommended. Electronically Signed   By: Genevie Ann M.D.   On: 10/06/2018 16:22   Ct Head Wo Contrast  Result Date: 10/06/2018 CLINICAL DATA:  Fall EXAM: CT HEAD WITHOUT CONTRAST CT CERVICAL SPINE WITHOUT CONTRAST TECHNIQUE: Multidetector CT imaging of the head and cervical spine was performed following the standard protocol without intravenous contrast. Multiplanar CT image reconstructions of the cervical spine were also generated. COMPARISON:  10/03/2018 FINDINGS: CT HEAD FINDINGS Brain: No evidence of acute infarction, hemorrhage, hydrocephalus, extra-axial collection or mass lesion/mass effect. Periventricular white matter hypodensity. Vascular: No hyperdense vessel or unexpected calcification. Skull: There is a very heterogeneous calvarium with multiple tiny lytic appearing lesions. Sinuses/Orbits: No acute finding. Other: None. CT CERVICAL SPINE FINDINGS Alignment: Normal. Skull base and vertebrae: No acute fracture. Profound osteopenia. No evidence focal lesion. Soft tissues and spinal canal: No prevertebral fluid or swelling. No visible canal hematoma. Disc levels:  Mild multilevel disc space height loss. Upper chest: Negative. Other: None. IMPRESSION: 1.  No acute intracranial pathology. 2.  Small-vessel white matter disease. 3. No fracture or static subluxation of the cervical spine. Profound osteopenia somewhat limits evaluation. MRI may be helpful to evaluate for cervical or other fracture if suspected given degree of osteopenia. 4. Profound osteopenia of the cervical spine as noted above and a very heterogeneous appearing calvarium with multiple tiny lytic appearing lesions. This may be related to severe resorptive osteopenia or underlying osseous abnormality including myeloproliferative  disorder. Correlate with clinical history and metabolic status. Electronically Signed   By: Eddie Candle M.D.   On: 10/06/2018 16:10   Ct Cervical Spine Wo Contrast  Result Date: 10/06/2018 CLINICAL DATA:  Fall EXAM: CT HEAD WITHOUT CONTRAST CT CERVICAL SPINE WITHOUT CONTRAST TECHNIQUE: Multidetector CT imaging of the head and cervical spine was performed following the standard protocol without intravenous contrast. Multiplanar CT image reconstructions of the cervical spine were also generated. COMPARISON:  10/03/2018 FINDINGS: CT HEAD FINDINGS Brain: No evidence of acute infarction, hemorrhage, hydrocephalus, extra-axial collection or mass lesion/mass effect. Periventricular white matter hypodensity. Vascular: No hyperdense vessel or unexpected calcification. Skull: There is a very heterogeneous calvarium with multiple tiny lytic appearing lesions. Sinuses/Orbits: No acute finding. Other: None. CT CERVICAL SPINE FINDINGS Alignment: Normal. Skull base and vertebrae: No acute fracture. Profound osteopenia. No evidence focal lesion. Soft tissues and spinal canal: No prevertebral fluid or swelling. No visible canal hematoma. Disc levels:  Mild multilevel disc space height loss. Upper chest: Negative. Other: None. IMPRESSION: 1.  No acute intracranial pathology. 2.  Small-vessel white matter disease. 3. No fracture or static subluxation of the cervical spine. Profound osteopenia somewhat limits evaluation. MRI may be helpful to evaluate for cervical or other fracture if suspected given degree of osteopenia. 4. Profound osteopenia of the cervical spine as noted above and a very heterogeneous appearing calvarium with multiple tiny lytic appearing lesions. This may be related to severe resorptive osteopenia or underlying osseous abnormality including myeloproliferative disorder. Correlate with clinical history and metabolic status. Electronically Signed   By: Eddie Candle M.D.   On: 10/06/2018 16:10     Procedures Procedures (including critical care time)  Medications Ordered in ED Medications - No data to display   Initial Impression / Assessment and Plan / ED Course  I have reviewed the triage  vital signs and the nursing notes.  Pertinent labs & imaging results that were available during my care of the patient were reviewed by me and considered in my medical decision making (see chart for details).     MDM:  Imaging: CT head and C-spine show no acute pathology.  Stable small vessel white matter disease.  No fracture or static subluxation of the C-spine.  Profound osteopenia of the C-spine with multiple tiny lytic lesions which may be secondary to resorptive osteopenia or underlying osseous abnormality including myeloproliferative disorder.  Chest and pelvic films negative for acute pathology.  ED Provider Interpretation of EKG: Atrial fibrillation with a rate of 88 bpm, normal axis, no ST segment elevation or depression or pathologic T wave abnormalities.  QTC prolonged at 596.  Labs: CMP with creatinine 1.17 which is her baseline otherwise unremarkable CMP, CBC with hemoglobin of 8.9 (prior 8.5), troponin 0.03, UA negative  On initial evaluation, patient appears uncomfortable. Afebrile and hemodynamically stable.  Patient is alert and disoriented but is at her baseline per her daughter at the bedside.  On exam, patient has tenderness about her neck as described above with the areas where cervical collar by EMS was in place.  It appeared she had the smallest and short of cervical collar on available by EMS.  She was very frustrated that she was wearing a cervical collar and demanding that it be removed.  Transitioned over to a [place] collar with EMT Belenda Cruise) assisting.  Patient still felt uncomfortable and refused to wear the cervical collar.  At that time, patient was disoriented but at her baseline.  She verbalized risks and benefits of the decision including possible  permanent paralysis, respiratory insufficiency, or inability to feed herself, walking, or death.  She accepted those risks.  Patient started bedside throughout this conversation and ultimately agreed to remove cervical collar excepting the risks as we had not yet had imaging and she is disoriented.  Patient was more comfortable after cervical collar was removed.  CT head and C-spine show no acute pathology.  Multiple osteopenic changes and lytic lesions likely secondary to osteopenia without evidence for significant metabolic disorder at this time.  Chest x-ray no acute pathology.  Pelvis x-ray negative for acute pathology and patient has no tenderness about the hips or pelvis.  Labs as above largely at baseline without evidence for infectious or other acute etiology.    EKG showed atrial fibrillation with no evidence for acute ischemic abnormality.  Has a known history of A. fib and is anticoagulated.  No chest pain or dyspnea or palpitations.  Denies syncope and presyncope and did not lose consciousness after falling.  Remained at her neurologic baseline in the ED without evidence for CVA or TIA.  Chart review shows patient was in the ED on 10/03/2018 and underwent CT head as well as MRI brain showing no acute intracranial abnormality.  Flow void in the right ICA unchanged from findings in 2018.  No infectious signs or symptoms at home.    On continue discussion, patient states that she was washing her hair in the sink and then had fallen back into the shower.  She thinks that she changes her head position quickly and had moved her head into an upright direction.  She then got fell backward without lightheadedness or chest pain or dyspnea.  Known carotid stenosis and essentially absent vision in the left eye secondary to macular degeneration.  Suspect that her decreased proprioception and likely transient perfusion status precipitated  her imbalance.  She has no deficits indicative of CVA or TIA at this time  and feels normal.  She like to be discharged home.  Discussed with patient and daughter extensively that she needs to follow-up with her primary care physician for further evaluation and was counseled extensively on slow deliberate movements with focus on safety especially while her daughter is not at home.  Reports understanding and was discharged with return precautions.   The plan for this patient was discussed with Dr. Vanita Panda who voiced agreement and who oversaw evaluation and treatment of this patient.   The patient was fully informed and involved with the history taking, evaluation, workup including labs/images, and plan. The patient's concerns and questions were addressed to the patient's satisfaction and he expressed agreement with the plan to DC home.    Final Clinical Impressions(s) / ED Diagnoses   Final diagnoses:  Fall, initial encounter    ED Discharge Orders    None       Oluwaseun Bruyere, Rodena Goldmann, MD 10/06/18 1916    Carmin Muskrat, MD 10/07/18 1843

## 2018-10-06 NOTE — ED Triage Notes (Addendum)
Pt brought in by [facility] from home for an unwitnessed fall in her bathroom today. Pt states she remembers washing her hair, getting nauseous, and falling in to the wall. Per EMS there is a hole in the wall of her bathroom. Pt is unable to recall entire event, unsure of LOC. Pt A+Ox4, chief complaint is discomfort from c-collar. Pt endorsed lumbar pain on scene. Pt in NAD. Pt noted to be afib on the monitor, hx of same, is on blood thinners.

## 2019-01-11 ENCOUNTER — Telehealth: Payer: Self-pay | Admitting: Oncology

## 2019-01-12 ENCOUNTER — Inpatient Hospital Stay: Payer: Medicare Other | Attending: Oncology | Admitting: Oncology

## 2019-01-12 ENCOUNTER — Other Ambulatory Visit: Payer: Self-pay

## 2019-01-12 ENCOUNTER — Encounter: Payer: Self-pay | Admitting: Oncology

## 2019-01-12 ENCOUNTER — Inpatient Hospital Stay: Payer: Medicare Other

## 2019-01-12 DIAGNOSIS — D539 Nutritional anemia, unspecified: Secondary | ICD-10-CM

## 2019-01-12 DIAGNOSIS — N183 Chronic kidney disease, stage 3 unspecified: Secondary | ICD-10-CM

## 2019-01-12 DIAGNOSIS — D631 Anemia in chronic kidney disease: Secondary | ICD-10-CM

## 2019-01-12 DIAGNOSIS — E8809 Other disorders of plasma-protein metabolism, not elsewhere classified: Secondary | ICD-10-CM | POA: Insufficient documentation

## 2019-01-12 DIAGNOSIS — R5383 Other fatigue: Secondary | ICD-10-CM

## 2019-01-12 LAB — RETIC PANEL
Immature Retic Fract: 26.1 % — ABNORMAL HIGH (ref 2.3–15.9)
RBC.: 2.34 MIL/uL — ABNORMAL LOW (ref 3.87–5.11)
Retic Count, Absolute: 59.7 10*3/uL (ref 19.0–186.0)
Retic Ct Pct: 2.6 % (ref 0.4–3.1)
Reticulocyte Hemoglobin: 32.8 pg (ref >27.9)

## 2019-01-12 LAB — CBC WITH DIFFERENTIAL/PLATELET
Abs Immature Granulocytes: 0.05 10*3/uL (ref 0.00–0.07)
Basophils Absolute: 0 10*3/uL (ref 0.0–0.1)
Basophils Relative: 0 %
Eosinophils Absolute: 0 10*3/uL (ref 0.0–0.5)
Eosinophils Relative: 1 %
HCT: 25.3 % — ABNORMAL LOW (ref 36.0–46.0)
Hemoglobin: 7.8 g/dL — ABNORMAL LOW (ref 12.0–15.0)
Immature Granulocytes: 1 %
Lymphocytes Relative: 22 %
Lymphs Abs: 1 10*3/uL (ref 0.7–4.0)
MCH: 33.3 pg (ref 26.0–34.0)
MCHC: 30.8 g/dL (ref 30.0–36.0)
MCV: 108.1 fL — ABNORMAL HIGH (ref 80.0–100.0)
Monocytes Absolute: 0.4 10*3/uL (ref 0.1–1.0)
Monocytes Relative: 10 %
Neutro Abs: 2.9 10*3/uL (ref 1.7–7.7)
Neutrophils Relative %: 66 %
Platelets: 199 10*3/uL (ref 150–400)
RBC: 2.34 MIL/uL — ABNORMAL LOW (ref 3.87–5.11)
RDW: 19.4 % — ABNORMAL HIGH (ref 11.5–15.5)
WBC: 4.3 10*3/uL (ref 4.0–10.5)
nRBC: 0.5 % — ABNORMAL HIGH (ref 0.0–0.2)

## 2019-01-12 LAB — COMPREHENSIVE METABOLIC PANEL
ALT: 12 U/L (ref 0–44)
AST: 25 U/L (ref 15–41)
Albumin: 3.9 g/dL (ref 3.5–5.0)
Alkaline Phosphatase: 94 U/L (ref 38–126)
Anion gap: 12 (ref 5–15)
BUN: 43 mg/dL — ABNORMAL HIGH (ref 8–23)
CO2: 32 mmol/L (ref 22–32)
Calcium: 10 mg/dL (ref 8.9–10.3)
Chloride: 94 mmol/L — ABNORMAL LOW (ref 98–111)
Creatinine, Ser: 1.34 mg/dL — ABNORMAL HIGH (ref 0.44–1.00)
GFR calc Af Amer: 41 mL/min — ABNORMAL LOW (ref >60)
GFR calc non Af Amer: 35 mL/min — ABNORMAL LOW (ref >60)
Glucose, Bld: 119 mg/dL — ABNORMAL HIGH (ref 70–99)
Potassium: 3.3 mmol/L — ABNORMAL LOW (ref 3.5–5.1)
Sodium: 138 mmol/L (ref 135–145)
Total Bilirubin: 0.7 mg/dL (ref 0.3–1.2)
Total Protein: 8.3 g/dL — ABNORMAL HIGH (ref 6.5–8.1)

## 2019-01-12 LAB — TECHNOLOGIST SMEAR REVIEW

## 2019-01-12 LAB — IRON AND TIBC
Iron: 72 ug/dL (ref 28–170)
Saturation Ratios: 23 % (ref 10.4–31.8)
TIBC: 311 ug/dL (ref 250–450)
UIBC: 239 ug/dL

## 2019-01-12 LAB — FERRITIN: Ferritin: 141 ng/mL (ref 11–307)

## 2019-01-12 NOTE — Progress Notes (Signed)
Patient contacted for telehealth visit. Pt is hard of hearing and daughter was present with her during assessment.

## 2019-01-12 NOTE — Progress Notes (Signed)
HEMATOLOGY-ONCOLOGY TeleHEALTH VISIT INITIAL CONSULTATION  I connected with Cassie Huffman on 01/12/19 at 10:45 AM EDT by video enabled telemedicine visit and verified that I am speaking with the correct person using two identifiers. I discussed the limitations, risks, security and privacy concerns of performing an evaluation and management service by telemedicine and the availability of in-person appointments. I also discussed with the patient that there may be a patient responsible charge related to this service. The patient expressed understanding and agreed to proceed.   Other persons participating in the visit and their role in the encounter:  Janeann Merl, RN, check in patient.  Patient's daughter, Cassie Huffman to set up video visit and also reported history.  Patient's location: Home  Provider's location: Home office  Referring provider: Kirk Ruths, MD  Chief Complaint: Initial consultation for evaluation of anemia.  HISTORY OF PRESENT ILLNESS Cassie Huffman is a 85 y.o. female who was seen in consultation at the request of Kirk Ruths, MD for evaluation of anemia. Patient is a poor historian.  History was obtained mainly from daughter.   Patient was recently seen by Dr. Frazier Richards.  Her labs showed hemoglobin 8.4, MCV 108.4, platelet 254. Reviewed patient's previous lab results in [facility] system via care everywhere, Anemia is progressively worse. Her baseline hemoglobin was 11.4 in 2017 which decreased to between 10-10.4 in 2019, further decreased to 9 earlier this year and now further decreased to 8.4.  MCV also progressively increasing this year.   Fatigue: reports worsening fatigue. Chronic onset, perisistent, no aggravating or improving factors, no associated symptoms.  Vitamin B12 was checked on 10/16/2018 with a level of 521, folate 6.8. Patient lives with daughter.  Walks with a walker with assistance in her house. Appetite is fair.  Denies  any fever, chills, nausea, vomiting, chest pain, abdominal pain.   Review of Systems  Constitutional: Positive for fatigue. Negative for appetite change, chills and fever.  HENT:   Negative for hearing loss and voice change.   Eyes: Negative for eye problems.  Respiratory: Negative for chest tightness and cough.   Cardiovascular: Negative for chest pain.  Gastrointestinal: Negative for abdominal distention, abdominal pain and blood in stool.  Endocrine: Negative for hot flashes.  Genitourinary: Negative for difficulty urinating and frequency.   Musculoskeletal: Positive for arthralgias.  Skin: Negative for itching and rash.  Neurological: Negative for dizziness.  Hematological: Negative for adenopathy.  Psychiatric/Behavioral: Negative for confusion.    Past Medical History:  Diagnosis Date  . A-fib ([place])   . Cancer ([place])   . Diabetes mellitus without complication ([place])   . Hypertension    Past Surgical History:  Procedure Laterality Date  . bilateral breast surgery  2000    Family History  Problem Relation Age of Onset  . Stroke Father   . Breast cancer Sister     Social History   Socioeconomic History  . Marital status: Single    Spouse name: Not on file  . Number of children: Not on file  . Years of education: Not on file  . Highest education level: Not on file  Occupational History  . Not on file  Social Needs  . Financial resource strain: Not on file  . Food insecurity:    Worry: Not on file    Inability: Not on file  . Transportation needs:    Medical: Not on file    Non-medical: Not on file  Tobacco Use  . Smoking status: Never Smoker  .  Smokeless tobacco: Never Used  Substance and Sexual Activity  . Alcohol use: No  . Drug use: Never  . Sexual activity: Not on file  Lifestyle  . Physical activity:    Days per week: Not on file    Minutes per session: Not on file  . Stress: Not on file  Relationships  . Social connections:    Talks on phone:  Not on file    Gets together: Not on file    Attends religious service: Not on file    Active member of club or organization: Not on file    Attends meetings of clubs or organizations: Not on file    Relationship status: Not on file  . Intimate partner violence:    Fear of current or ex partner: Not on file    Emotionally abused: Not on file    Physically abused: Not on file    Forced sexual activity: Not on file  Other Topics Concern  . Not on file  Social History Narrative  . Not on file    Current Outpatient Medications on File Prior to Visit  Medication Sig Dispense Refill  . atorvastatin (LIPITOR) 20 MG tablet Take 20 mg by mouth daily.    . cholestyramine (QUESTRAN) 4 g packet Take 4 g by mouth daily. With water    . diclofenac sodium (VOLTAREN) 1 % GEL Apply 1 application topically as needed.    . ferrous sulfate 325 (65 FE) MG tablet Take by mouth.    . magnesium oxide (MAG-OX) 400 MG tablet Take by mouth.    . meclizine (ANTIVERT) 25 MG tablet Take 1/2 - 1 tablet every 4 hours prn dizziness or nausea    . metFORMIN (GLUCOPHAGE) 500 MG tablet Take 500 mg by mouth daily.    . metolazone (ZAROXOLYN) 2.5 MG tablet Take 2.5 mg by mouth daily as needed (feet swelling).     . metoprolol succinate (TOPROL-XL) 25 MG 24 hr tablet Take 25 mg by mouth at bedtime.     . Multiple Vitamins-Minerals (PRESERVISION AREDS 2) CAPS Take 1 capsule by mouth 2 (two) times daily.    Marland Kitchen MYRBETRIQ 25 MG TB24 tablet Take 25 mg by mouth daily.    . potassium chloride (K-DUR) 10 MEQ tablet Take 10 mEq by mouth 2 (two) times daily.    Marland Kitchen torsemide (DEMADEX) 10 MG tablet Take 10 mg by mouth daily.    . traMADol (ULTRAM) 50 MG tablet Take 50-100 mg by mouth every 6 (six) hours as needed for pain.    Marland Kitchen venlafaxine XR (EFFEXOR-XR) 37.5 MG 24 hr capsule Take 75 mg by mouth daily.     Alveda Reasons 15 MG TABS tablet Take 15 mg by mouth daily.     No current facility-administered medications on file prior to visit.      Allergies  Allergen Reactions  . Tape Hives    Pt states that she gets whelps that spread out from the Bandaid Pt states that she gets whelps that spread out from the Bandaid Pt states that she gets whelps that spread out from the Bandaid Pt states that she gets whelps that spread out from the Bandaid Pt states that she gets whelps that spread out from the [place]  . Pseudoephedrine Itching  . Bacitracin-Polymyxin B Rash    Okay to use paper band-aids;  "allergic to the adhesive tape"  . Procaine Palpitations       Observations/Objective: There were no vitals filed for this  visit. There is no height or weight on file to calculate BMI.  Physical Exam  I have personally reviewed below laboratory results.  CBC    Component Value Date/Time   WBC 4.3 01/12/2019 1335   RBC 2.34 (L) 01/12/2019 1335   RBC 2.34 (L) 01/12/2019 1335   HGB 7.8 (L) 01/12/2019 1335   HGB 11.8 (L) 09/13/2014 1749   HCT 25.3 (L) 01/12/2019 1335   HCT 36.5 09/13/2014 1749   PLT 199 01/12/2019 1335   PLT 320 09/13/2014 1749   MCV 108.1 (H) 01/12/2019 1335   MCV 99 09/13/2014 1749   MCH 33.3 01/12/2019 1335   MCHC 30.8 01/12/2019 1335   RDW 19.4 (H) 01/12/2019 1335   RDW 16.8 (H) 09/13/2014 1749   LYMPHSABS 1.0 01/12/2019 1335   LYMPHSABS 1.1 09/13/2014 1749   MONOABS 0.4 01/12/2019 1335   MONOABS 0.7 09/13/2014 1749   EOSABS 0.0 01/12/2019 1335   EOSABS 0.1 09/13/2014 1749   BASOSABS 0.0 01/12/2019 1335   BASOSABS 0.0 09/13/2014 1749    CMP     Component Value Date/Time   NA 138 01/12/2019 1335   NA 136 09/13/2014 1749   K 3.3 (L) 01/12/2019 1335   K 3.6 09/13/2014 1749   CL 94 (L) 01/12/2019 1335   CL 102 09/13/2014 1749   CO2 32 01/12/2019 1335   CO2 28 09/13/2014 1749   GLUCOSE 119 (H) 01/12/2019 1335   GLUCOSE 133 (H) 09/13/2014 1749   BUN 43 (H) 01/12/2019 1335   BUN 8 09/13/2014 1749   CREATININE 1.34 (H) 01/12/2019 1335   CREATININE 0.55 (L) 09/13/2014 1749   CALCIUM 10.0  01/12/2019 1335   CALCIUM 8.7 09/13/2014 1749   PROT 8.3 (H) 01/12/2019 1335   PROT 8.2 09/13/2014 1749   ALBUMIN 3.9 01/12/2019 1335   ALBUMIN 3.5 09/13/2014 1749   AST 25 01/12/2019 1335   AST 27 09/13/2014 1749   ALT 12 01/12/2019 1335   ALT 20 09/13/2014 1749   ALKPHOS 94 01/12/2019 1335   ALKPHOS 133 (H) 09/13/2014 1749   BILITOT 0.7 01/12/2019 1335   BILITOT 0.6 09/13/2014 1749   GFRNONAA 35 (L) 01/12/2019 1335   GFRNONAA >60 09/13/2014 1749   GFRAA 41 (L) 01/12/2019 1335   GFRAA >60 09/13/2014 1749     RADIOGRAPHIC STUDIES: I have personally reviewed the radiological images as listed and agreed with the findings in the report.  10/06/2018 DG chest 1 view.  Stable cardiac enlargement. Low lung volume with vascular crowding and streaky basilar atelectasis but no infiltrate or effusions.   10/04/2018 MR brain wo contrast No acute intracranial abnormality. Moderated advanced cerebral atrophy, with chronic small vessel ischemic disease. Mildly progressed related to 2018. Abnormal flow void within the right ICA to the level of the terminus, likely occluded. Finding stable relative to 2018.   Assessment and Plan: 1. Macrocytic anemia   2. Stage 3 chronic kidney disease (HCC)   3. Other fatigue   4. Anemia due to stage 3 chronic kidney disease (HCC)   Previous labs, notes and images were independently reviewed by me and discussed with patient and her daughter.  Patient has chronic anemia which is progressively getting worse in the past 12 months. MCV progressively increasing. Anemia, differential includes anemia secondary to chronic kidney disease, nutritional deficiency, chronic inflammation, underlying bone marrow findings. Patient did have vitamin B12 and folate checked 3 months ago which were normal. Suspect underlying bone marrow malignancy, ie, MDS  Check CBC w differential,  CMP, vitamin B12, Folate, iron/TIBC, ferritin, reticulocytes panel ,  blood smear, TSH,  LDH,  monoclonal gammopathy evaluation, flowcytometry.   Today's lab results were reviewed. MM panel and Flow are pending.  Normal iron panel, high Vitamin B12 level, elevated creatinine with decreased GFR,  Likely CKD.  Anemia can have a component of due to CKD, however, I suspect she has underling MDS.   Also, discussed with the patient that if no clear etiology found- bone marrow biopsy would be suggested. Currently await for the above workup.   # Patient follow-up with me in approximately 2 weeks to review the above results.  Follow Up Instructions: Doximity visit in one week to discuss work up results.   Cc Kirk Ruths, MD  I discussed the assessment and treatment plan with the patient. The patient was provided an opportunity to ask questions and all were answered. The patient agreed with the plan and demonstrated an understanding of the instructions.  The patient was advised to call back or seek an in-person evaluation if the symptoms worsen or if the condition fails to improve as anticipated.   I provided 45 minutes of face-to-face video visit time during this encounter, and > 50% was spent counseling as documented under my assessment & plan.  Earlie Server, MD 01/12/2019 8:30 PM

## 2019-01-13 LAB — VITAMIN B12: Vitamin B-12: 1134 pg/mL — ABNORMAL HIGH (ref 180–914)

## 2019-01-14 LAB — METHYLMALONIC ACID, SERUM: Methylmalonic Acid, Quantitative: 306 nmol/L (ref 0–378)

## 2019-01-15 LAB — COMP PANEL: LEUKEMIA/LYMPHOMA

## 2019-01-15 LAB — MULTIPLE MYELOMA PANEL, SERUM
Albumin SerPl Elph-Mcnc: 3.8 g/dL (ref 2.9–4.4)
Albumin/Glob SerPl: 0.9 (ref 0.7–1.7)
Alpha 1: 0.3 g/dL (ref 0.0–0.4)
Alpha2 Glob SerPl Elph-Mcnc: 0.8 g/dL (ref 0.4–1.0)
B-Globulin SerPl Elph-Mcnc: 0.9 g/dL (ref 0.7–1.3)
Gamma Glob SerPl Elph-Mcnc: 2.2 g/dL — ABNORMAL HIGH (ref 0.4–1.8)
Globulin, Total: 4.3 g/dL — ABNORMAL HIGH (ref 2.2–3.9)
IgA: 37 mg/dL — ABNORMAL LOW (ref 64–422)
IgG (Immunoglobin G), Serum: 3010 mg/dL — ABNORMAL HIGH (ref 586–1602)
IgM (Immunoglobulin M), Srm: 12 mg/dL — ABNORMAL LOW (ref 26–217)
M Protein SerPl Elph-Mcnc: 2.1 g/dL — ABNORMAL HIGH
Total Protein ELP: 8.1 g/dL (ref 6.0–8.5)

## 2019-01-15 LAB — KAPPA/LAMBDA LIGHT CHAINS
Kappa free light chain: 31.2 mg/L — ABNORMAL HIGH (ref 3.3–19.4)
Kappa, lambda light chain ratio: 0.14 — ABNORMAL LOW (ref 0.26–1.65)
Lambda free light chains: 228.1 mg/L — ABNORMAL HIGH (ref 5.7–26.3)

## 2019-01-19 ENCOUNTER — Telehealth: Payer: Self-pay | Admitting: *Deleted

## 2019-01-19 ENCOUNTER — Inpatient Hospital Stay (HOSPITAL_BASED_OUTPATIENT_CLINIC_OR_DEPARTMENT_OTHER): Payer: Medicare Other | Admitting: Oncology

## 2019-01-19 ENCOUNTER — Ambulatory Visit
Admission: RE | Admit: 2019-01-19 | Discharge: 2019-01-19 | Disposition: A | Discharge status: Home / Self Care | Payer: Medicare Other | Source: Ambulatory Visit | Attending: Oncology | Admitting: Oncology

## 2019-01-19 ENCOUNTER — Other Ambulatory Visit: Payer: Self-pay

## 2019-01-19 ENCOUNTER — Encounter: Payer: Self-pay | Admitting: Oncology

## 2019-01-19 DIAGNOSIS — D539 Nutritional anemia, unspecified: Secondary | ICD-10-CM

## 2019-01-19 DIAGNOSIS — E8809 Other disorders of plasma-protein metabolism, not elsewhere classified: Secondary | ICD-10-CM

## 2019-01-19 DIAGNOSIS — N183 Chronic kidney disease, stage 3 unspecified: Secondary | ICD-10-CM

## 2019-01-19 DIAGNOSIS — D631 Anemia in chronic kidney disease: Secondary | ICD-10-CM

## 2019-01-19 DIAGNOSIS — R5383 Other fatigue: Secondary | ICD-10-CM

## 2019-01-19 NOTE — Progress Notes (Signed)
HEMATOLOGY-ONCOLOGY TeleHEALTH VISIT PROGRESS NOTE  I connected with Cassie Huffman on 01/19/19 at  9:45 AM EDT by video enabled telemedicine visit and verified that I am speaking with the correct person using two identifiers. I discussed the limitations, risks, security and privacy concerns of performing an evaluation and management service by telemedicine and the availability of in-person appointments. I also discussed with the patient that there may be a patient responsible charge related to this service. The patient expressed understanding and agreed to proceed.   Other persons participating in the visit and their role in the encounter:  Geraldine Solar, CMA, check in patient   Patient's daughter Cassie Huffman to set up video visit and participate in discussion.  Patient's location: Home  Provider's location: work Risk analyst Complaint: Follow-up for discussion of lab work-up results and further management plan for progressive anemia.   INTERVAL HISTORY Cassie Huffman is a 85 y.o. female who has above history reviewed by me today presents for follow up visit for discussion of lab work-up results and further management plan for progressive anemia. Problems and complaints are listed below:  Patient is a poor historian.  History obtained from daughter. Per daughter, patient has no new complaints.  Continues to have fatigue. No bleeding fever, chills.  Review of Systems  Unable to perform ROS: Age  Constitutional: Positive for fatigue.    Past Medical History:  Diagnosis Date  . A-fib ([place])   . Cancer ([place])   . Diabetes mellitus without complication ([place])   . Hypertension    Past Surgical History:  Procedure Laterality Date  . bilateral breast surgery  2000    Family History  Problem Relation Age of Onset  . Stroke Father   . Breast cancer Sister     Social History   Socioeconomic History  . Marital status: Single    Spouse name: Not on file  . Number of children: Not on file  . Years of  education: Not on file  . Highest education level: Not on file  Occupational History  . Not on file  Social Needs  . Financial resource strain: Not on file  . Food insecurity:    Worry: Not on file    Inability: Not on file  . Transportation needs:    Medical: Not on file    Non-medical: Not on file  Tobacco Use  . Smoking status: Never Smoker  . Smokeless tobacco: Never Used  Substance and Sexual Activity  . Alcohol use: No  . Drug use: Never  . Sexual activity: Not on file  Lifestyle  . Physical activity:    Days per week: Not on file    Minutes per session: Not on file  . Stress: Not on file  Relationships  . Social connections:    Talks on phone: Not on file    Gets together: Not on file    Attends religious service: Not on file    Active member of club or organization: Not on file    Attends meetings of clubs or organizations: Not on file    Relationship status: Not on file  . Intimate partner violence:    Fear of current or ex partner: Not on file    Emotionally abused: Not on file    Physically abused: Not on file    Forced sexual activity: Not on file  Other Topics Concern  . Not on file  Social History Narrative  . Not on file    Current Outpatient Medications on File  Prior to Visit  Medication Sig Dispense Refill  . atorvastatin (LIPITOR) 20 MG tablet Take 20 mg by mouth daily.    . cholestyramine (QUESTRAN) 4 g packet Take 4 g by mouth daily. With water    . diclofenac sodium (VOLTAREN) 1 % GEL Apply 1 application topically as needed.    . ferrous sulfate 325 (65 FE) MG tablet Take by mouth.    . magnesium oxide (MAG-OX) 400 MG tablet Take by mouth.    . meclizine (ANTIVERT) 25 MG tablet Take 1/2 - 1 tablet every 4 hours prn dizziness or nausea    . metFORMIN (GLUCOPHAGE) 500 MG tablet Take 500 mg by mouth daily.    . metolazone (ZAROXOLYN) 2.5 MG tablet Take 2.5 mg by mouth daily as needed (feet swelling).     . metoprolol succinate (TOPROL-XL) 25 MG 24  hr tablet Take 25 mg by mouth at bedtime.     . Multiple Vitamins-Minerals (PRESERVISION AREDS 2) CAPS Take 1 capsule by mouth 2 (two) times daily.    . potassium chloride (K-DUR) 10 MEQ tablet Take 10 mEq by mouth 2 (two) times daily.    Marland Kitchen torsemide (DEMADEX) 10 MG tablet Take 10 mg by mouth daily.    . traMADol (ULTRAM) 50 MG tablet Take 50-100 mg by mouth every 6 (six) hours as needed for pain.    Marland Kitchen venlafaxine XR (EFFEXOR-XR) 37.5 MG 24 hr capsule Take 75 mg by mouth daily.     Alveda Reasons 15 MG TABS tablet Take 15 mg by mouth daily.     No current facility-administered medications on file prior to visit.     Allergies  Allergen Reactions  . Tape Hives    Pt states that she gets whelps that spread out from the Bandaid Pt states that she gets whelps that spread out from the Bandaid Pt states that she gets whelps that spread out from the Bandaid Pt states that she gets whelps that spread out from the [place] Pt states that she gets whelps that spread out from the [place]  . Pseudoephedrine Itching  . Bacitracin-Polymyxin B Rash    Okay to use paper band-aids;  "allergic to the adhesive tape"  . Procaine Palpitations       Observations/Objective: Today's Vitals   01/19/19 0932  PainSc: 0-No pain   There is no height or weight on file to calculate BMI.  Physical Exam  Constitutional: No distress.  Frail elderly female sitting the chair.  HENT:  Head: Normocephalic and atraumatic.  Pulmonary/Chest: Effort normal.  Neurological: She is alert.  Psychiatric: Affect normal.    CBC    Component Value Date/Time   WBC 4.3 01/12/2019 1335   RBC 2.34 (L) 01/12/2019 1335   RBC 2.34 (L) 01/12/2019 1335   HGB 7.8 (L) 01/12/2019 1335   HGB 11.8 (L) 09/13/2014 1749   HCT 25.3 (L) 01/12/2019 1335   HCT 36.5 09/13/2014 1749   PLT 199 01/12/2019 1335   PLT 320 09/13/2014 1749   MCV 108.1 (H) 01/12/2019 1335   MCV 99 09/13/2014 1749   MCH 33.3 01/12/2019 1335   MCHC 30.8 01/12/2019  1335   RDW 19.4 (H) 01/12/2019 1335   RDW 16.8 (H) 09/13/2014 1749   LYMPHSABS 1.0 01/12/2019 1335   LYMPHSABS 1.1 09/13/2014 1749   MONOABS 0.4 01/12/2019 1335   MONOABS 0.7 09/13/2014 1749   EOSABS 0.0 01/12/2019 1335   EOSABS 0.1 09/13/2014 1749   BASOSABS 0.0 01/12/2019 1335   BASOSABS  0.0 09/13/2014 1749    CMP     Component Value Date/Time   NA 138 01/12/2019 1335   NA 136 09/13/2014 1749   K 3.3 (L) 01/12/2019 1335   K 3.6 09/13/2014 1749   CL 94 (L) 01/12/2019 1335   CL 102 09/13/2014 1749   CO2 32 01/12/2019 1335   CO2 28 09/13/2014 1749   GLUCOSE 119 (H) 01/12/2019 1335   GLUCOSE 133 (H) 09/13/2014 1749   BUN 43 (H) 01/12/2019 1335   BUN 8 09/13/2014 1749   CREATININE 1.34 (H) 01/12/2019 1335   CREATININE 0.55 (L) 09/13/2014 1749   CALCIUM 10.0 01/12/2019 1335   CALCIUM 8.7 09/13/2014 1749   PROT 8.3 (H) 01/12/2019 1335   PROT 8.2 09/13/2014 1749   ALBUMIN 3.9 01/12/2019 1335   ALBUMIN 3.5 09/13/2014 1749   AST 25 01/12/2019 1335   AST 27 09/13/2014 1749   ALT 12 01/12/2019 1335   ALT 20 09/13/2014 1749   ALKPHOS 94 01/12/2019 1335   ALKPHOS 133 (H) 09/13/2014 1749   BILITOT 0.7 01/12/2019 1335   BILITOT 0.6 09/13/2014 1749   GFRNONAA 35 (L) 01/12/2019 1335   GFRNONAA >60 09/13/2014 1749   GFRAA 41 (L) 01/12/2019 1335   GFRAA >60 09/13/2014 1749     Assessment and Plan: 1. Plasma cell dyscrasia   2. Macrocytic anemia   3. Stage 3 chronic kidney disease ([place])   4. Anemia due to stage 3 chronic kidney disease ([place])   5. Other fatigue     Labs are reviewed and discussed with patient. Anemia, multifactorial.  Anemia secondary to CKD as well as underlying bone marrow disorders.  See below. Multiple myeloma panel showed IgG monoclonal protein with lambda light chain specificity.  M protein 2.1. Free kappa lambda ratio 0.14 Flow cytometry shows monotypic plasma cells, less than 1% of glucoside consistent with plasma cell dyscrasia.  Microcytosis, high  vitamin B12 level.  Reticulocyte panel shows normal reticulocyte hemoglobin indicating adequate iron stores.  Increased immature reticulocyte fraction.  Inappropriately normal absolute reticulocyte count.  Discussed with patient her daughter that I am concerned that patient has an underlying bone marrow disorder, i.e. multiple myeloma and/or MDS I recommend bone marrow biopsy for further evaluation. Also will obtain skeletal survey.   Follow Up Instructions: 10 days after bone marrow biopsy to go over lab results.  In person visit.  Orders Placed This Encounter  Procedures  . DG Bone Survey Met    Standing Status:   Future    Number of Occurrences:   1    Standing Expiration Date:   03/18/2020    Order Specific Question:   Reason for Exam (SYMPTOM  OR DIAGNOSIS REQUIRED)    Answer:   suspect multiple myeloma    Order Specific Question:   Preferred imaging location?    Answer:   [facility]  . CT BONE MARROW BIOPSY & ASPIRATION    Standing Status:   Future    Standing Expiration Date:   04/20/2020    Order Specific Question:   Reason for Exam (SYMPTOM  OR DIAGNOSIS REQUIRED)    Answer:   anemia, suspect MM/MDS    Order Specific Question:   Preferred location?    Answer:   [facility]    Order Specific Question:   Radiology Contrast Protocol - do NOT remove file path    Answer:   [email or web address]    I discussed the assessment and treatment plan with the patient. The patient was  provided an opportunity to ask questions and all were answered. The patient agreed with the plan and demonstrated an understanding of the instructions.  The patient was advised to call back or seek an in-person evaluation if the symptoms worsen or if the condition fails to improve as anticipated.    Earlie Server, MD 01/19/2019 6:12 PM

## 2019-01-19 NOTE — Telephone Encounter (Signed)
Skeletal survey Per 01/19/19 los.   Called patient's (Daughter) Jimmy Picket And made her aware of this appt. She stated that she was aware.

## 2019-01-19 NOTE — Progress Notes (Signed)
Called patient for Telehealth visit via [place].  Chart review was completed with the help of patients daughter, Juliann Pulse.

## 2019-01-24 ENCOUNTER — Telehealth: Payer: Self-pay | Admitting: *Deleted

## 2019-01-24 NOTE — Telephone Encounter (Signed)
Patient to see Dr. Tasia Catchings 10 days after her BM Biopsy per 01/19/19 los. Appt was scheduled for 02/05/19 @ 8:45 A detailed message was left on patient / Daughter-Cathy Dwaine Gale vmail making Her aware of her upcoming scheduled MD appt.

## 2019-01-25 ENCOUNTER — Other Ambulatory Visit: Payer: Self-pay | Admitting: Radiology

## 2019-01-26 ENCOUNTER — Ambulatory Visit
Admission: RE | Admit: 2019-01-26 | Discharge: 2019-01-26 | Disposition: A | Discharge status: Home / Self Care | Payer: Medicare Other | Source: Ambulatory Visit | Attending: Oncology | Admitting: Oncology

## 2019-01-26 ENCOUNTER — Other Ambulatory Visit (HOSPITAL_COMMUNITY)
Admission: RE | Admit: 2019-01-26 | Disposition: A | Discharge status: Home / Self Care | Payer: Medicare Other | Source: Ambulatory Visit | Attending: Oncology | Admitting: Oncology

## 2019-01-26 ENCOUNTER — Other Ambulatory Visit: Payer: Self-pay

## 2019-01-26 DIAGNOSIS — I509 Heart failure, unspecified: Secondary | ICD-10-CM | POA: Diagnosis not present

## 2019-01-26 DIAGNOSIS — E8809 Other disorders of plasma-protein metabolism, not elsewhere classified: Secondary | ICD-10-CM | POA: Diagnosis present

## 2019-01-26 DIAGNOSIS — E119 Type 2 diabetes mellitus without complications: Secondary | ICD-10-CM | POA: Insufficient documentation

## 2019-01-26 DIAGNOSIS — Z803 Family history of malignant neoplasm of breast: Secondary | ICD-10-CM | POA: Insufficient documentation

## 2019-01-26 DIAGNOSIS — Z7984 Long term (current) use of oral hypoglycemic drugs: Secondary | ICD-10-CM | POA: Insufficient documentation

## 2019-01-26 DIAGNOSIS — I110 Hypertensive heart disease with heart failure: Secondary | ICD-10-CM | POA: Diagnosis not present

## 2019-01-26 DIAGNOSIS — Z79899 Other long term (current) drug therapy: Secondary | ICD-10-CM | POA: Diagnosis not present

## 2019-01-26 DIAGNOSIS — Z859 Personal history of malignant neoplasm, unspecified: Secondary | ICD-10-CM | POA: Insufficient documentation

## 2019-01-26 DIAGNOSIS — I4891 Unspecified atrial fibrillation: Secondary | ICD-10-CM | POA: Insufficient documentation

## 2019-01-26 DIAGNOSIS — Z888 Allergy status to other drugs, medicaments and biological substances status: Secondary | ICD-10-CM | POA: Insufficient documentation

## 2019-01-26 DIAGNOSIS — D649 Anemia, unspecified: Secondary | ICD-10-CM | POA: Diagnosis not present

## 2019-01-26 DIAGNOSIS — Z7901 Long term (current) use of anticoagulants: Secondary | ICD-10-CM | POA: Insufficient documentation

## 2019-01-26 HISTORY — DX: Heart failure, unspecified: I50.9

## 2019-01-26 HISTORY — DX: Umbilical hernia without obstruction or gangrene: K42.9

## 2019-01-26 LAB — CBC WITH DIFFERENTIAL/PLATELET
Abs Immature Granulocytes: 0.05 10*3/uL (ref 0.00–0.07)
Basophils Absolute: 0 10*3/uL (ref 0.0–0.1)
Basophils Relative: 0 %
Eosinophils Absolute: 0.1 10*3/uL (ref 0.0–0.5)
Eosinophils Relative: 1 %
HCT: 22.9 % — ABNORMAL LOW (ref 36.0–46.0)
Hemoglobin: 6.9 g/dL — ABNORMAL LOW (ref 12.0–15.0)
Immature Granulocytes: 1 %
Lymphocytes Relative: 29 %
Lymphs Abs: 1.2 10*3/uL (ref 0.7–4.0)
MCH: 32.9 pg (ref 26.0–34.0)
MCHC: 30.1 g/dL (ref 30.0–36.0)
MCV: 109 fL — ABNORMAL HIGH (ref 80.0–100.0)
Monocytes Absolute: 0.4 10*3/uL (ref 0.1–1.0)
Monocytes Relative: 9 %
Neutro Abs: 2.4 10*3/uL (ref 1.7–7.7)
Neutrophils Relative %: 60 %
Platelets: 202 10*3/uL (ref 150–400)
RBC: 2.1 MIL/uL — ABNORMAL LOW (ref 3.87–5.11)
RDW: 19.4 % — ABNORMAL HIGH (ref 11.5–15.5)
WBC: 4.1 10*3/uL (ref 4.0–10.5)
nRBC: 0 % (ref 0.0–0.2)

## 2019-01-26 LAB — PROTIME-INR
INR: 1.2 (ref 0.8–1.2)
Prothrombin Time: 15.5 seconds — ABNORMAL HIGH (ref 11.4–15.2)

## 2019-01-26 LAB — GLUCOSE, CAPILLARY: Glucose-Capillary: 121 mg/dL — ABNORMAL HIGH (ref 70–99)

## 2019-01-26 MED ORDER — SODIUM CHLORIDE 0.9 % IV SOLN
INTRAVENOUS | Status: DC
  Administered 2019-01-26: 10 mL/h via INTRAVENOUS

## 2019-01-26 MED ORDER — MIDAZOLAM HCL 2 MG/2ML IJ SOLN
INTRAMUSCULAR | Status: AC
  Filled 2019-01-26: qty 4

## 2019-01-26 MED ORDER — FENTANYL CITRATE (PF) 100 MCG/2ML IJ SOLN
INTRAMUSCULAR | Status: AC
  Filled 2019-01-26: qty 4

## 2019-01-26 MED ORDER — HEPARIN SOD (PORK) LOCK FLUSH 100 UNIT/ML IV SOLN
INTRAVENOUS | Status: AC
  Filled 2019-01-26: qty 5

## 2019-01-26 NOTE — Discharge Instructions (Signed)

## 2019-01-26 NOTE — H&P (Signed)
Chief Complaint: Patient was seen in consultation today for No chief complaint on file.  at the request of Yu,Zhou  Referring Physician(s): Yu,Zhou  Supervising Physician: Marybelle Killings  Patient Status: [facility] - Out-pt  History of Present Illness: Cassie Huffman is a 85 y.o. female with anemia who is referred for bone marrow biopsy.  She feels well today and is asymptomatic.  Past Medical History:  Diagnosis Date  . A-fib ([place])   . Cancer ([place])   . CHF (congestive heart failure) ([place])   . Diabetes mellitus without complication ([place])   . Hypertension   . Umbilical hernia     Past Surgical History:  Procedure Laterality Date  . bilateral breast surgery  2000    Allergies: Tape  Medications: Prior to Admission medications   Medication Sig Start Date End Date Taking? Authorizing Provider  atorvastatin (LIPITOR) 20 MG tablet Take 20 mg by mouth daily. 01/27/16  Yes [provider]  cholestyramine (QUESTRAN) 4 g packet Take 4 g by mouth daily. With water 09/23/18  Yes [provider]  ferrous sulfate 325 (65 FE) MG tablet Take by mouth 2 (two) times daily with a meal.  10/12/18 10/12/19 Yes [provider]  magnesium oxide (MAG-OX) 400 MG tablet Take by mouth. 10/18/18 10/18/19 Yes [provider]  meclizine (ANTIVERT) 25 MG tablet Take 1/2 - 1 tablet every 4 hours prn dizziness or nausea 11/09/18  Yes [provider]  metFORMIN (GLUCOPHAGE) 500 MG tablet Take 500 mg by mouth daily. 09/30/18  Yes [provider]  metoprolol succinate (TOPROL-XL) 25 MG 24 hr tablet Take 25 mg by mouth at bedtime.  10/26/16  Yes [provider]  Multiple Vitamins-Minerals (PRESERVISION AREDS 2) CAPS Take 1 capsule by mouth 2 (two) times daily.   Yes [provider]  potassium chloride (K-DUR) 10 MEQ tablet Take 10 mEq by mouth 2 (two) times daily. 09/07/18  Yes [provider]  torsemide (DEMADEX) 10 MG tablet Take 10 mg by  mouth daily. 06/22/18 06/22/19 Yes [provider]  traMADol (ULTRAM) 50 MG tablet Take 50-100 mg by mouth every 6 (six) hours as needed for pain. 03/01/18  Yes [provider]  venlafaxine XR (EFFEXOR-XR) 37.5 MG 24 hr capsule Take 75 mg by mouth daily.  09/23/18  Yes [provider]  XARELTO 15 MG TABS tablet Take 15 mg by mouth daily. 11/04/16  Yes [provider]  diclofenac sodium (VOLTAREN) 1 % GEL Apply 1 application topically as needed. 04/21/18   [provider]  metolazone (ZAROXOLYN) 2.5 MG tablet Take 2.5 mg by mouth daily as needed (feet swelling).  09/22/18 09/22/19  [provider]     Family History  Problem Relation Age of Onset  . Stroke Father   . Breast cancer Sister     Social History   Socioeconomic History  . Marital status: Single    Spouse name: Not on file  . Number of children: Not on file  . Years of education: Not on file  . Highest education level: Not on file  Occupational History  . Not on file  Social Needs  . Financial resource strain: Not on file  . Food insecurity:    Worry: Not on file    Inability: Not on file  . Transportation needs:    Medical: Not on file    Non-medical: Not on file  Tobacco Use  . Smoking status: Never Smoker  . Smokeless tobacco: Never Used  Substance and Sexual Activity  . Alcohol use: No  . Drug use: Never  . Sexual activity: Not on file  Lifestyle  . Physical activity:    Days per week: Not on file    Minutes per session: Not on file  . Stress: Not on file  Relationships  . Social connections:    Talks on phone: Not on file    Gets together: Not on file    Attends religious service: Not on file    Active member of club or organization: Not on file    Attends meetings of clubs or organizations: Not on file    Relationship status: Not on file  Other Topics Concern  . Not on file  Social History Narrative  . Not on file      Review of Systems: A 12  point ROS discussed and pertinent positives are indicated in the HPI above.  All other systems are negative.  Review of Systems  No fevers or chills.  No nausea or vomiting.  No chest pain or shortness of breath.  Vital Signs: BP 129/60   Pulse 69   Temp 98.3 F (36.8 C) (Oral)   Resp 15   Ht 4' 11"  (1.499 m)   Wt 77.1 kg   SpO2 100%   BMI 34.34 kg/m   Physical Exam Constitutional:      Appearance: Normal appearance.  HENT:     Head: Normocephalic and atraumatic.  Cardiovascular:     Rate and Rhythm: Normal rate and regular rhythm.  Pulmonary:     Effort: Pulmonary effort is normal.     Breath sounds: Normal breath sounds.  Neurological:     General: No focal deficit present.     Mental Status: She is alert and oriented to person, place, and time.     Imaging: Dg Bone Survey Met  Result Date: 01/20/2019 CLINICAL DATA:  85 year old female with suspicion of multiple myeloma EXAM: METASTATIC BONE SURVEY COMPARISON:  None. FINDINGS: Skull: Small lucencies visualized throughout the calvarium on the lateral view. Chest: Cardiomegaly.  Osteopenia. Coarsened interstitial markings throughout the lungs with blunting of the bilateral costophrenic angles and pleuroparenchymal thickening on the left. Cervical spine: Osteopenia. Cervical elements maintain relative anatomic alignment. No acute displaced fracture. Questionable lucency involving spinous process of C4. Degenerative changes. Right upper extremity: Posttraumatic deformity of the proximal right humerus.  Osteopenia. No lytic changes identified within the right upper extremity. Left upper extremity: Advanced degenerative changes at the glenohumeral joint and acromioclavicular joint. Osteopenia. No lytic changes identified in the left upper extremity. Pelvis: No lytic lesions identified. No displaced fracture. Degenerative changes of the sacroiliac joints and bilateral hips. Right lower extremity: Questionable lucency in the distal  right femur. Osteopenia.  No displaced fracture. Left lower extremity: Questionable lucency in the distal left femur and proximal tibia. No acute displaced fracture. Osteopenia. Thoracic spine: Osteopenia. Accentuated kyphotic curvature.  No acute displaced fracture. Multilevel degenerative changes. Questionable rounded lucencies involving the vertebral bodies of T6, T7, T10, T11, and T12. Lumbar spine: Osteopenia. Questionable lucencies involving lower thoracic vertebral bodies again demonstrated. No lucency of the lumbar spine identified. No free acute fracture identified with vertebral body heights maintained. Degenerative changes of the lumbar spine includes facet disease and disc space narrowing. Aortic atherosclerosis. IMPRESSION: Advanced osteopenia somewhat limits the exam, however, there is evidence of multiple lucent lesions suggesting metastatic disease/multiple myeloma, predominantly involving the skull, with additional questionable focal lucencies of posterior elements of C4, thoracic vertebral bodies (  T6, T7, T10, T11, T12), bilateral femurs, left tibia. Posttraumatic deformity of the right humerus. Multilevel degenerative changes of the cervical, thoracic, lumbar spine. Electronically Signed   By: Corrie Mckusick D.O.   On: 01/20/2019 15:50    Labs:  CBC: Recent Labs    10/03/18 2308 10/06/18 1648 01/12/19 1335 01/26/19 0749  WBC 6.1 6.4 4.3 4.1  HGB 8.5* 8.9* 7.8* 6.9*  HCT 27.9* 29.7* 25.3* 22.9*  PLT 218 220 199 202    COAGS: Recent Labs    10/03/18 2308 01/26/19 0749  INR 1.38 1.2    BMP: Recent Labs    10/03/18 2308 10/06/18 1648 01/12/19 1335  NA 138 141 138  K 3.1* 3.9 3.3*  CL 93* 100 94*  CO2 32 29 32  GLUCOSE 138* 121* 119*  BUN 29* 21 43*  CALCIUM 9.3 9.8 10.0  CREATININE 1.44* 1.17* 1.34*  GFRNONAA 32* 41* 35*  GFRAA 37* 48* 41*    LIVER FUNCTION TESTS: Recent Labs    10/03/18 2308 10/06/18 1648 01/12/19 1335  BILITOT 1.2 1.3* 0.7  AST 24 24  25   ALT 13 16 12   ALKPHOS 69 70 94  PROT 7.8 7.9 8.3*  ALBUMIN 3.5 3.6 3.9    TUMOR MARKERS: No results for input(s): AFPTM, CEA, CA199, CHROMGRNA in the last 8760 hours.  Assessment and Plan:  Anemia.  Bone marrow biopsy is to follow.  Thank you for this interesting consult.  I greatly enjoyed meeting Dajanee Voorheis and look forward to participating in their care.  A copy of this report was sent to the requesting provider on this date.  Electronically Signed: Art A Vania Rosero, MD 01/26/2019, 9:10 AM   I spent a total of  40 Minutes   in face to face in clinical consultation, greater than 50% of which was counseling/coordinating care for bone marrow biopsy.

## 2019-01-26 NOTE — Procedures (Signed)
R Iliac BM Bx EBL 0 Comp 0

## 2019-01-31 ENCOUNTER — Other Ambulatory Visit: Payer: Self-pay | Admitting: *Deleted

## 2019-01-31 ENCOUNTER — Other Ambulatory Visit: Payer: Self-pay | Admitting: Oncology

## 2019-01-31 ENCOUNTER — Telehealth: Payer: Self-pay | Admitting: *Deleted

## 2019-01-31 DIAGNOSIS — D649 Anemia, unspecified: Secondary | ICD-10-CM

## 2019-01-31 NOTE — Telephone Encounter (Signed)
Per MD Result Note to schedule patient for lab/1 Unit of Blood on 6/4 and DOX VISIT on 6/5 appts were scheduled as requested. I was unable to reach her nor her daughter Nelda Luckey) by phone to make them aware of the scheduled appts.. However,  a detailed message was left on her vmail making her aware of the scheduled appt date and times.

## 2019-02-01 ENCOUNTER — Other Ambulatory Visit: Payer: Self-pay

## 2019-02-01 ENCOUNTER — Inpatient Hospital Stay: Payer: Medicare Other

## 2019-02-01 ENCOUNTER — Encounter (HOSPITAL_COMMUNITY): Payer: Self-pay | Admitting: Oncology

## 2019-02-01 ENCOUNTER — Inpatient Hospital Stay: Payer: Medicare Other | Attending: Oncology

## 2019-02-01 ENCOUNTER — Ambulatory Visit: Payer: Medicare Other

## 2019-02-01 ENCOUNTER — Other Ambulatory Visit: Payer: Self-pay | Admitting: Oncology

## 2019-02-01 DIAGNOSIS — D539 Nutritional anemia, unspecified: Secondary | ICD-10-CM

## 2019-02-01 DIAGNOSIS — E8809 Other disorders of plasma-protein metabolism, not elsewhere classified: Secondary | ICD-10-CM

## 2019-02-01 DIAGNOSIS — N183 Chronic kidney disease, stage 3 (moderate): Secondary | ICD-10-CM | POA: Diagnosis not present

## 2019-02-01 DIAGNOSIS — D649 Anemia, unspecified: Secondary | ICD-10-CM | POA: Insufficient documentation

## 2019-02-01 DIAGNOSIS — C9000 Multiple myeloma not having achieved remission: Secondary | ICD-10-CM | POA: Insufficient documentation

## 2019-02-01 DIAGNOSIS — Z7952 Long term (current) use of systemic steroids: Secondary | ICD-10-CM | POA: Diagnosis not present

## 2019-02-01 LAB — CBC WITH DIFFERENTIAL/PLATELET
Abs Immature Granulocytes: 0.06 10*3/uL (ref 0.00–0.07)
Basophils Absolute: 0 10*3/uL (ref 0.0–0.1)
Basophils Relative: 1 %
Eosinophils Absolute: 0 10*3/uL (ref 0.0–0.5)
Eosinophils Relative: 1 %
HCT: 22.3 % — ABNORMAL LOW (ref 36.0–46.0)
Hemoglobin: 6.8 g/dL — ABNORMAL LOW (ref 12.0–15.0)
Immature Granulocytes: 1 %
Lymphocytes Relative: 21 %
Lymphs Abs: 0.9 10*3/uL (ref 0.7–4.0)
MCH: 33 pg (ref 26.0–34.0)
MCHC: 30.5 g/dL (ref 30.0–36.0)
MCV: 108.3 fL — ABNORMAL HIGH (ref 80.0–100.0)
Monocytes Absolute: 0.4 10*3/uL (ref 0.1–1.0)
Monocytes Relative: 9 %
Neutro Abs: 2.9 10*3/uL (ref 1.7–7.7)
Neutrophils Relative %: 67 %
Platelets: 235 10*3/uL (ref 150–400)
RBC: 2.06 MIL/uL — ABNORMAL LOW (ref 3.87–5.11)
RDW: 19.4 % — ABNORMAL HIGH (ref 11.5–15.5)
WBC: 4.2 10*3/uL (ref 4.0–10.5)
nRBC: 0.7 % — ABNORMAL HIGH (ref 0.0–0.2)

## 2019-02-01 LAB — SAMPLE TO BLOOD BANK

## 2019-02-01 LAB — ABO/RH: ABO/RH(D): AB POS

## 2019-02-02 ENCOUNTER — Other Ambulatory Visit: Payer: Self-pay

## 2019-02-02 ENCOUNTER — Inpatient Hospital Stay: Payer: Medicare Other

## 2019-02-02 ENCOUNTER — Other Ambulatory Visit: Payer: Self-pay | Admitting: *Deleted

## 2019-02-02 ENCOUNTER — Inpatient Hospital Stay: Payer: Medicare Other | Admitting: Oncology

## 2019-02-02 DIAGNOSIS — D649 Anemia, unspecified: Secondary | ICD-10-CM

## 2019-02-02 DIAGNOSIS — E8809 Other disorders of plasma-protein metabolism, not elsewhere classified: Secondary | ICD-10-CM

## 2019-02-02 DIAGNOSIS — D539 Nutritional anemia, unspecified: Secondary | ICD-10-CM

## 2019-02-02 LAB — TYPE AND SCREEN
ABO/RH(D): AB POS
Antibody Screen: NEGATIVE
Unit division: 0

## 2019-02-02 LAB — BPAM RBC
Blood Product Expiration Date: 202006182359
Unit Type and Rh: 6200

## 2019-02-02 LAB — PREPARE RBC (CROSSMATCH)

## 2019-02-02 MED ORDER — SODIUM CHLORIDE 0.9% IV SOLUTION
250.0000 mL | Freq: Once | INTRAVENOUS | Status: AC
  Administered 2019-02-02: 250 mL via INTRAVENOUS
  Filled 2019-02-02: qty 250

## 2019-02-02 MED ORDER — DIPHENHYDRAMINE HCL 25 MG PO CAPS
25.0000 mg | ORAL_CAPSULE | Freq: Once | ORAL | Status: AC
  Administered 2019-02-02: 25 mg via ORAL
  Filled 2019-02-02: qty 1

## 2019-02-02 MED ORDER — ACETAMINOPHEN 325 MG PO TABS
650.0000 mg | ORAL_TABLET | Freq: Once | ORAL | Status: AC
  Administered 2019-02-02: 650 mg via ORAL
  Filled 2019-02-02: qty 2

## 2019-02-03 LAB — BPAM RBC
Blood Product Expiration Date: 202006182359
ISSUE DATE / TIME: 202006051201
Unit Type and Rh: 6200

## 2019-02-03 LAB — TYPE AND SCREEN
ABO/RH(D): AB POS
Antibody Screen: NEGATIVE
Unit division: 0

## 2019-02-05 ENCOUNTER — Ambulatory Visit: Payer: Medicare Other | Admitting: Oncology

## 2019-02-05 ENCOUNTER — Inpatient Hospital Stay: Payer: Medicare Other | Admitting: Oncology

## 2019-02-08 ENCOUNTER — Ambulatory Visit: Payer: Medicare Other | Admitting: Oncology

## 2019-02-08 ENCOUNTER — Telehealth: Payer: Self-pay | Admitting: Oncology

## 2019-02-09 ENCOUNTER — Telehealth: Payer: Self-pay | Admitting: Pharmacist

## 2019-02-09 ENCOUNTER — Telehealth: Payer: Self-pay

## 2019-02-09 ENCOUNTER — Encounter: Payer: Self-pay | Admitting: Oncology

## 2019-02-09 ENCOUNTER — Other Ambulatory Visit: Payer: Self-pay

## 2019-02-09 ENCOUNTER — Inpatient Hospital Stay (HOSPITAL_BASED_OUTPATIENT_CLINIC_OR_DEPARTMENT_OTHER): Payer: Medicare Other | Admitting: Oncology

## 2019-02-09 DIAGNOSIS — Z7189 Other specified counseling: Secondary | ICD-10-CM

## 2019-02-09 DIAGNOSIS — N183 Chronic kidney disease, stage 3 unspecified: Secondary | ICD-10-CM

## 2019-02-09 DIAGNOSIS — C9000 Multiple myeloma not having achieved remission: Secondary | ICD-10-CM

## 2019-02-09 DIAGNOSIS — D649 Anemia, unspecified: Secondary | ICD-10-CM

## 2019-02-09 MED ORDER — DEXAMETHASONE 4 MG PO TABS: 20.0000 mg | ORAL_TABLET | ORAL | 0 refills | Status: DC

## 2019-02-09 NOTE — Telephone Encounter (Signed)
Patient account for Revlimid created in [place]. [employer] # [id number]

## 2019-02-09 NOTE — Progress Notes (Signed)
Patient contacted for telehealth visit. [name], daughter, present with her.

## 2019-02-09 NOTE — Telephone Encounter (Signed)
Oral Oncology Pharmacist Encounter  Received new prescription for Revlimid (lenalidomide) for the treatment of plasma cell myeloma in conjunction with dexamethasone, planned duration until disease progression or unacceptable drug toxicity.  CMP from 01/12/2019 assessed, no relevant lab abnormalities. Prescription dose and frequency assessed. Dose adjusted to CrCl and age/treatment tolerability considerations  Current medication list in Epic reviewed, no relevant DDIs with Revlimid identified.  Oral Oncology Clinic will continue to follow for insurance authorization, copayment issues, initial counseling and start date.  Darl Pikes, PharmD, BCPS, [facility] Hematology/Oncology Clinical Pharmacist [facility]/HP/AP [facility] [place] [facility] [phone]  02/09/2019 3:25 PM

## 2019-02-10 DIAGNOSIS — N183 Chronic kidney disease, stage 3 unspecified: Secondary | ICD-10-CM | POA: Insufficient documentation

## 2019-02-10 DIAGNOSIS — D649 Anemia, unspecified: Secondary | ICD-10-CM | POA: Insufficient documentation

## 2019-02-10 DIAGNOSIS — C9000 Multiple myeloma not having achieved remission: Secondary | ICD-10-CM | POA: Insufficient documentation

## 2019-02-10 DIAGNOSIS — Z7189 Other specified counseling: Secondary | ICD-10-CM | POA: Insufficient documentation

## 2019-02-10 MED ORDER — LENALIDOMIDE 5 MG PO CAPS: ORAL_CAPSULE | ORAL | 0 refills | Status: DC

## 2019-02-10 NOTE — Progress Notes (Signed)
HEMATOLOGY-ONCOLOGY TeleHEALTH VISIT PROGRESS NOTE  I connected with Cassie Huffman on 02/10/19 at  3:00 PM EDT by video enabled telemedicine visit and verified that I am speaking with the correct person using two identifiers. I discussed the limitations, risks, security and privacy concerns of performing an evaluation and management service by telemedicine and the availability of in-person appointments. I also discussed with the patient that there may be a patient responsible charge related to this service. The patient expressed understanding and agreed to proceed.   Other persons participating in the visit and their role in the encounter:  Cassie Merl, RN, check in patient.  Daughter Cassie Huffman  Patient's location: Home  Provider's location: work Risk analyst Complaint: Follow-up for discussion of pulmonary biopsy results.   INTERVAL HISTORY Cassie Huffman is a 85 y.o. female who has above history reviewed by me today presents for follow up visit for management of anemia, discussion of bone marrow biopsy results. Problems and complaints are listed below:  Patient turned 85 years old last week. Had a bone marrow biopsy on 01/26/2019. Today she reports no new complaints. She received a unit of PRBC last week.  Reports chronic fatigue has not improved.  Review of Systems  Unable to perform ROS: Age  Constitutional: Positive for fatigue.  Respiratory: Negative for hemoptysis.   Cardiovascular: Negative for chest pain.  Gastrointestinal: Negative for abdominal distention and abdominal pain.  Endocrine: Negative for hot flashes.  Genitourinary: Negative for dysuria.   Musculoskeletal: Negative for flank pain.  Skin: Negative for rash.  Neurological: Negative for dizziness.  Hematological: Negative for adenopathy.  Psychiatric/Behavioral: Negative for confusion. The patient is not nervous/anxious.     Past Medical History:  Diagnosis Date  . A-fib ([place])   . Cancer ([place])   . CHF (congestive  heart failure) ([place])   . Diabetes mellitus without complication ([place])   . Hypertension   . Umbilical hernia    Past Surgical History:  Procedure Laterality Date  . bilateral breast surgery  2000    Family History  Problem Relation Age of Onset  . Stroke Father   . Breast cancer Sister     Social History   Socioeconomic History  . Marital status: Single    Spouse name: Not on file  . Number of children: Not on file  . Years of education: Not on file  . Highest education level: Not on file  Occupational History  . Not on file  Social Needs  . Financial resource strain: Not on file  . Food insecurity    Worry: Not on file    Inability: Not on file  . Transportation needs    Medical: Not on file    Non-medical: Not on file  Tobacco Use  . Smoking status: Never Smoker  . Smokeless tobacco: Never Used  Substance and Sexual Activity  . Alcohol use: No  . Drug use: Never  . Sexual activity: Not on file  Lifestyle  . Physical activity    Days per week: Not on file    Minutes per session: Not on file  . Stress: Not on file  Relationships  . Social Herbalist on phone: Not on file    Gets together: Not on file    Attends religious service: Not on file    Active member of club or organization: Not on file    Attends meetings of clubs or organizations: Not on file    Relationship status: Not on file  .  Intimate partner violence    Fear of current or ex partner: Not on file    Emotionally abused: Not on file    Physically abused: Not on file    Forced sexual activity: Not on file  Other Topics Concern  . Not on file  Social History Narrative  . Not on file    Current Outpatient Medications on File Prior to Visit  Medication Sig Dispense Refill  . atorvastatin (LIPITOR) 20 MG tablet Take 20 mg by mouth daily.    . cholestyramine (QUESTRAN) 4 g packet Take 4 g by mouth daily. With water    . diclofenac sodium (VOLTAREN) 1 % GEL Apply 1 application topically  as needed.    . ferrous sulfate 325 (65 FE) MG tablet Take by mouth 2 (two) times daily with a meal.     . magnesium oxide (MAG-OX) 400 MG tablet Take by mouth.    . meclizine (ANTIVERT) 25 MG tablet Take 1/2 - 1 tablet every 4 hours prn dizziness or nausea    . metFORMIN (GLUCOPHAGE) 500 MG tablet Take 500 mg by mouth daily.    . metolazone (ZAROXOLYN) 2.5 MG tablet Take 2.5 mg by mouth daily as needed (feet swelling).     . metoprolol succinate (TOPROL-XL) 25 MG 24 hr tablet Take 25 mg by mouth at bedtime.     . Multiple Vitamins-Minerals (PRESERVISION AREDS 2) CAPS Take 1 capsule by mouth 2 (two) times daily.    . potassium chloride (K-DUR) 10 MEQ tablet Take 10 mEq by mouth 2 (two) times daily.    Marland Kitchen torsemide (DEMADEX) 10 MG tablet Take 10 mg by mouth daily.    . traMADol (ULTRAM) 50 MG tablet Take 50-100 mg by mouth every 6 (six) hours as needed for pain.    Marland Kitchen venlafaxine XR (EFFEXOR-XR) 37.5 MG 24 hr capsule Take 75 mg by mouth daily.     Alveda Reasons 15 MG TABS tablet Take 15 mg by mouth daily.     No current facility-administered medications on file prior to visit.     Allergies  Allergen Reactions  . Tape Hives    Pt states that she gets whelps that spread out from the Bandaid Pt states that she gets whelps that spread out from the Bandaid Pt states that she gets whelps that spread out from the Bandaid Pt states that she gets whelps that spread out from the Bandaid Pt states that she gets whelps that spread out from the Bandaid       Observations/Objective: There were no vitals filed for this visit. There is no height or weight on file to calculate BMI.  Physical Exam  Constitutional: No distress.  HENT:  Head: Normocephalic and atraumatic.  Neurological: She is alert.  Psychiatric: Affect normal.    CBC    Component Value Date/Time   WBC 4.2 02/01/2019 1030   RBC 2.06 (L) 02/01/2019 1030   HGB 6.8 (L) 02/01/2019 1030   HGB 11.8 (L) 09/13/2014 1749   HCT 22.3 (L)  02/01/2019 1030   HCT 36.5 09/13/2014 1749   PLT 235 02/01/2019 1030   PLT 320 09/13/2014 1749   MCV 108.3 (H) 02/01/2019 1030   MCV 99 09/13/2014 1749   MCH 33.0 02/01/2019 1030   MCHC 30.5 02/01/2019 1030   RDW 19.4 (H) 02/01/2019 1030   RDW 16.8 (H) 09/13/2014 1749   LYMPHSABS 0.9 02/01/2019 1030   LYMPHSABS 1.1 09/13/2014 1749   MONOABS 0.4 02/01/2019 1030  MONOABS 0.7 09/13/2014 1749   EOSABS 0.0 02/01/2019 1030   EOSABS 0.1 09/13/2014 1749   BASOSABS 0.0 02/01/2019 1030   BASOSABS 0.0 09/13/2014 1749    CMP     Component Value Date/Time   NA 138 01/12/2019 1335   NA 136 09/13/2014 1749   K 3.3 (L) 01/12/2019 1335   K 3.6 09/13/2014 1749   CL 94 (L) 01/12/2019 1335   CL 102 09/13/2014 1749   CO2 32 01/12/2019 1335   CO2 28 09/13/2014 1749   GLUCOSE 119 (H) 01/12/2019 1335   GLUCOSE 133 (H) 09/13/2014 1749   BUN 43 (H) 01/12/2019 1335   BUN 8 09/13/2014 1749   CREATININE 1.34 (H) 01/12/2019 1335   CREATININE 0.55 (L) 09/13/2014 1749   CALCIUM 10.0 01/12/2019 1335   CALCIUM 8.7 09/13/2014 1749   PROT 8.3 (H) 01/12/2019 1335   PROT 8.2 09/13/2014 1749   ALBUMIN 3.9 01/12/2019 1335   ALBUMIN 3.5 09/13/2014 1749   AST 25 01/12/2019 1335   AST 27 09/13/2014 1749   ALT 12 01/12/2019 1335   ALT 20 09/13/2014 1749   ALKPHOS 94 01/12/2019 1335   ALKPHOS 133 (H) 09/13/2014 1749   BILITOT 0.7 01/12/2019 1335   BILITOT 0.6 09/13/2014 1749   GFRNONAA 35 (L) 01/12/2019 1335   GFRNONAA >60 09/13/2014 1749   GFRAA 41 (L) 01/12/2019 1335   GFRAA >60 09/13/2014 1749     Assessment and Plan: 1. CKD (chronic kidney disease) stage 3, GFR 30-59 ml/min (HCC)   2. Multiple myeloma not having achieved remission ([place])   3. Goals of care, counseling/discussion   4. Normocytic anemia     Labs are reviewed and discussed with patient and her daughter.  Bone marrow biopsy results were discussed. Skeletal survey images were independently reviewed by me and discussed. Advanced  osteopenia.  Evidence of multiple lucent lesions suggesting metastatic disease/multiple myeloma.  Predominantly involving skull, C4, thoracic vertebral bodies, bilateral femurs left tibia.  01/26/2019.  Biopsy showed involvement of plasma cells 50%, consistent with multiple myeloma. IgG lambda myeloma. Albumin 3.9 Need to obtain beta microglobulin at next visit.  The diagnosis of multiple myeloma and care plan were discussed with patient in detail.  The goal of treatment which is to palliate disease, disease related symptoms, improve quality of life and hopefully prolong life was highlighted in our discussion.  Chemotherapy education was provided.  We had discussed the composition of chemotherapy regimen, length of chemo cycle, duration of treatment and the time to assess response to treatment.    I explained to the patient the risks and benefits of chemotherapy including all but not limited to hair loss, mouth sore, nausea, vomiting, low blood counts, bleeding, thrombosis and risk of life threatening infection and even death, secondary malignancy etc.   Patient voices understanding and willing to proceed chemotherapy.   Supportive care measures are necessary for patient well-being and will be provided as necessary. We spent sufficient time to discuss many aspect of care, questions were answered to patient's satisfaction.  Discussed with patient and daughter that given patient's advanced age, multiple comorbidities, I recommend starting with weekly dexamethasone 20 mg. Plan Revlimid 5 mg day 1-21 if patient is able to tolerate. Goal of care was discussed.  Myeloma is not curable but treatable disease. Prescription of dexamethasone was sent to patient pharmacy.  Potential side effects of dexamethasone and Revlimid were discussed with patient.  We will look into patient's coverage for Revlimid. Patient follow-up in the clinic  in person for evaluation prior to Revlimid.  Follow Up Instructions: To  be determined.  Pending approval of Revlimid.   I discussed the assessment and treatment plan with the patient. The patient was provided an opportunity to ask questions and all were answered. The patient agreed with the plan and demonstrated an understanding of the instructions.  The patient was advised to call back or seek an in-person evaluation if the symptoms worsen or if the condition fails to improve as anticipated.   I provided 40 minutes of face-to-face video visit time during this encounter, and > 50% was spent counseling as documented under my assessment & plan.  Earlie Server, MD

## 2019-02-10 NOTE — Progress Notes (Signed)
START ON PATHWAY REGIMEN - Multiple Myeloma and Other Plasma Cell Dyscrasias     A cycle is every 21 days:     Bortezomib      Lenalidomide      Dexamethasone   **Always confirm dose/schedule in your pharmacy ordering system**  Patient Characteristics: Newly Diagnosed, Transplant Ineligible or Refused, Unknown or Awaiting Test Results R-ISS Staging: II Disease Classification: Newly Diagnosed Is Patient Eligible for Transplant<= Transplant Ineligible or Refused Risk Status: Unknown Intent of Therapy: Non-Curative / Palliative Intent, Discussed with Patient

## 2019-02-12 ENCOUNTER — Telehealth: Payer: Self-pay | Admitting: Pharmacy Technician

## 2019-02-12 NOTE — Telephone Encounter (Signed)
Left voicemail for daughter [name] to return my call.

## 2019-02-12 NOTE — Telephone Encounter (Addendum)
Oral Oncology Patient Advocate Encounter  Prior Authorization for Revlimid has been approved.    PA# [id number] Effective dates: 02/12/2019 through 08/30/2019  Patients co-pay is $1776.48.  Will apply for grant assistance to help reduce out of pocket expense to patient.  Oral Oncology Clinic will continue to follow.   [place] Patient [place] Phone [phone] Fax [phone] 02/12/2019 9:01 AM,

## 2019-02-12 NOTE — Telephone Encounter (Signed)
Oral Oncology Patient Advocate Encounter  Received notification from [employer] that prior authorization for Revlimid is required.  PA submitted on CoverMyMeds Key [id number]  Status is pending  Oral Oncology Clinic will continue to follow.  [place] Patient [place] Phone [phone] Fax [phone] 02/12/2019 9:00 AM

## 2019-02-12 NOTE — Telephone Encounter (Signed)
Thank you :)

## 2019-02-14 ENCOUNTER — Telehealth: Payer: Self-pay | Admitting: Pharmacy Technician

## 2019-02-14 MED ORDER — LENALIDOMIDE 5 MG PO CAPS: 5.0000 mg | ORAL_CAPSULE | Freq: Every day | ORAL | 0 refills | Status: DC

## 2019-02-14 NOTE — Telephone Encounter (Signed)
6/17: Emailed billing information to [employer] at [employer].

## 2019-02-14 NOTE — Telephone Encounter (Addendum)
Oral Oncology Patient Advocate Encounter  Was successful in securing patient a $11,000 grant from [facility] to provide copayment coverage for Revlimid.  This will keep the out of pocket expense at $0.     [employer] ID: [id number]  I have spoken with the patient..     The billing information is as follows and has been shared with [employer]:   RxBin: [id number] PCN: [id number] Member ID: [id number] Group ID: [id number] Dates of Eligibility: 01/15/2019 through 01/14/2020  [place] Patient [place] Phone [phone] Fax [phone] 02/14/2019 9:02 AM

## 2019-02-16 NOTE — Telephone Encounter (Signed)
First shipment of Revlimid scheduled to be delivered 6/19 from [employer].

## 2019-02-22 ENCOUNTER — Other Ambulatory Visit: Payer: Self-pay

## 2019-02-23 ENCOUNTER — Inpatient Hospital Stay: Payer: Medicare Other

## 2019-02-23 ENCOUNTER — Telehealth: Payer: Self-pay | Admitting: Pharmacist

## 2019-02-23 ENCOUNTER — Inpatient Hospital Stay (HOSPITAL_BASED_OUTPATIENT_CLINIC_OR_DEPARTMENT_OTHER): Payer: Medicare Other | Admitting: Oncology

## 2019-02-23 ENCOUNTER — Other Ambulatory Visit: Payer: Self-pay

## 2019-02-23 ENCOUNTER — Encounter: Payer: Self-pay | Admitting: Oncology

## 2019-02-23 VITALS — BP 140/93 | HR 80 | Temp 97.7°F | Resp 18 | Wt 145.7 lb

## 2019-02-23 DIAGNOSIS — C9000 Multiple myeloma not having achieved remission: Secondary | ICD-10-CM | POA: Diagnosis not present

## 2019-02-23 DIAGNOSIS — N183 Chronic kidney disease, stage 3 unspecified: Secondary | ICD-10-CM

## 2019-02-23 DIAGNOSIS — R5383 Other fatigue: Secondary | ICD-10-CM

## 2019-02-23 DIAGNOSIS — K921 Melena: Secondary | ICD-10-CM

## 2019-02-23 DIAGNOSIS — R197 Diarrhea, unspecified: Secondary | ICD-10-CM

## 2019-02-23 DIAGNOSIS — R682 Dry mouth, unspecified: Secondary | ICD-10-CM

## 2019-02-23 DIAGNOSIS — Z7952 Long term (current) use of systemic steroids: Secondary | ICD-10-CM

## 2019-02-23 DIAGNOSIS — D649 Anemia, unspecified: Secondary | ICD-10-CM | POA: Diagnosis not present

## 2019-02-23 DIAGNOSIS — K219 Gastro-esophageal reflux disease without esophagitis: Secondary | ICD-10-CM | POA: Insufficient documentation

## 2019-02-23 DIAGNOSIS — Z5111 Encounter for antineoplastic chemotherapy: Secondary | ICD-10-CM

## 2019-02-23 DIAGNOSIS — R110 Nausea: Secondary | ICD-10-CM

## 2019-02-23 DIAGNOSIS — R066 Hiccough: Secondary | ICD-10-CM

## 2019-02-23 DIAGNOSIS — Z7189 Other specified counseling: Secondary | ICD-10-CM

## 2019-02-23 LAB — COMPREHENSIVE METABOLIC PANEL
ALT: 15 U/L (ref 0–44)
AST: 22 U/L (ref 15–41)
Albumin: 3.6 g/dL (ref 3.5–5.0)
Alkaline Phosphatase: 71 U/L (ref 38–126)
Anion gap: 9 (ref 5–15)
BUN: 22 mg/dL (ref 8–23)
CO2: 27 mmol/L (ref 22–32)
Calcium: 9.2 mg/dL (ref 8.9–10.3)
Chloride: 101 mmol/L (ref 98–111)
Creatinine, Ser: 1.17 mg/dL — ABNORMAL HIGH (ref 0.44–1.00)
GFR calc Af Amer: 48 mL/min — ABNORMAL LOW (ref >60)
GFR calc non Af Amer: 41 mL/min — ABNORMAL LOW (ref >60)
Glucose, Bld: 175 mg/dL — ABNORMAL HIGH (ref 70–99)
Potassium: 3.6 mmol/L (ref 3.5–5.1)
Sodium: 137 mmol/L (ref 135–145)
Total Bilirubin: 0.7 mg/dL (ref 0.3–1.2)
Total Protein: 7.4 g/dL (ref 6.5–8.1)

## 2019-02-23 LAB — CBC WITH DIFFERENTIAL/PLATELET
Abs Immature Granulocytes: 0.05 10*3/uL (ref 0.00–0.07)
Basophils Absolute: 0 10*3/uL (ref 0.0–0.1)
Basophils Relative: 0 %
Eosinophils Absolute: 0.1 10*3/uL (ref 0.0–0.5)
Eosinophils Relative: 1 %
HCT: 30.2 % — ABNORMAL LOW (ref 36.0–46.0)
Hemoglobin: 9.4 g/dL — ABNORMAL LOW (ref 12.0–15.0)
Immature Granulocytes: 1 %
Lymphocytes Relative: 18 %
Lymphs Abs: 0.9 10*3/uL (ref 0.7–4.0)
MCH: 31.9 pg (ref 26.0–34.0)
MCHC: 31.1 g/dL (ref 30.0–36.0)
MCV: 102.4 fL — ABNORMAL HIGH (ref 80.0–100.0)
Monocytes Absolute: 0.4 10*3/uL (ref 0.1–1.0)
Monocytes Relative: 8 %
Neutro Abs: 3.5 10*3/uL (ref 1.7–7.7)
Neutrophils Relative %: 72 %
Platelets: 170 10*3/uL (ref 150–400)
RBC: 2.95 MIL/uL — ABNORMAL LOW (ref 3.87–5.11)
RDW: 20.3 % — ABNORMAL HIGH (ref 11.5–15.5)
WBC: 4.8 10*3/uL (ref 4.0–10.5)
nRBC: 0 % (ref 0.0–0.2)

## 2019-02-23 LAB — SAMPLE TO BLOOD BANK

## 2019-02-23 LAB — LACTATE DEHYDROGENASE: LDH: 140 U/L (ref 98–192)

## 2019-02-23 MED ORDER — FAMOTIDINE 20 MG PO TABS: 20.0000 mg | ORAL_TABLET | Freq: Every day | ORAL | 1 refills | Status: DC

## 2019-02-23 MED ORDER — DEXAMETHASONE 4 MG PO TABS: 20.0000 mg | ORAL_TABLET | ORAL | 0 refills | Status: DC

## 2019-02-23 NOTE — Progress Notes (Signed)
Patient here for follow up. Contacted her daughter, [name], for med reconciliation. Pts weight today is 145.7, per daughter patient weighed 153 on 5/6 @ PCPs office. [name] stated that patient has been having regurgitation problems making it hard for her to swallow.

## 2019-02-23 NOTE — Progress Notes (Addendum)
Hematology/Oncology Follow Up Note [facility]  [phone] [phone]  Patient Care Team: Kirk Ruths, MD as PCP - General (Internal Medicine)   Name of the patient: Cassie Huffman  [id number]  Aug 10, 1927   REASON FOR VISIT  follow-up for management of multiple myeloma  PERTINENT ONCOLOGY HISTORY Cassie Huffman is a 85 y.o.afemale who has above oncology history reviewed by me today presented for follow up visit for management of multiple myeloma Multiple myeloma panel showed IgG monoclonal protein with lambda light chain specificity.  M protein 2.1. Free kappa lambda ratio 0.14  01/26/2019.  Biopsy showed involvement of plasma cells 50%, consistent with multiple myeloma. IgG lambda myeloma. Albumin 3.9 Need to obtain beta microglobulin at next visit.  The diagnosis of multiple myeloma and care plan were discussed with patient in detail.  The goal of treatment which is to palliate disease, disease related symptoms, improve quality of life and hopefully prolong life was highlighted in our discussion.  Chemotherapy education was provided.  We had discussed the composition of chemotherapy regimen, using Revlimid and dexamethasone, length of chemo cycle, duration of treatment and the time to assess response to treatment.    Goal of care was discussed.  Myeloma is not curable but treatable disease.  INTERVAL HISTORY 85 y.o. female presents for evaluation prior to starting chemotherapy treatment for multiple myeloma. Patient has hearing difficulty.  Patient's caregiver daughter [name] was connected via video and she participated in providing history and discussion of management plan. Patient reports dry mouth, she breathes through mouth and nose. Patient has been started on 20 mg dexamethasone weekly.  Daughter reports that patient has received 2 doses of dexamethasone so far. Sometimes patient reports having silence hiccups otherwise no side  effects from dexamethasone.  Tolerating well. Also mentioned feeling dizzy when she woke up and sits up.  Dizziness resolved after a while after that.  Anemia, status post 1 unit of PRBC transfusion.  Reports fatigue is slightly better. Appetite is fair.  Patient likes to eat a lot of fruit and daughter is trying to convince patient to eat more protein containing food.  Review of Systems  Unable to perform ROS: Age (Hard hearing)  Constitutional: Positive for fatigue. Negative for appetite change.  HENT:   Positive for hearing loss.   Respiratory: Positive for shortness of breath.   Cardiovascular: Negative for chest pain and leg swelling.  Gastrointestinal: Negative for abdominal distention and abdominal pain.  Genitourinary: Negative for frequency.   Musculoskeletal: Negative for back pain.  Skin: Negative for rash.  Neurological: Positive for dizziness.  Hematological: Negative for adenopathy.  Psychiatric/Behavioral: The patient is not nervous/anxious.       Allergies  Allergen Reactions  . Tape Hives    Pt states that she gets whelps that spread out from the Bandaid Pt states that she gets whelps that spread out from the Bandaid Pt states that she gets whelps that spread out from the Bandaid Pt states that she gets whelps that spread out from the Bandaid Pt states that she gets whelps that spread out from the Bandaid     Past Medical History:  Diagnosis Date  . A-fib ([place])   . Cancer ([place])   . CHF (congestive heart failure) ([place])   . Diabetes mellitus without complication ([place])   . Hypertension   . Umbilical hernia      Past Surgical History:  Procedure Laterality Date  . bilateral breast surgery  2000  Social History   Socioeconomic History  . Marital status: Single    Spouse name: Not on file  . Number of children: Not on file  . Years of education: Not on file  . Highest education level: Not on file  Occupational History  . Not on file  Social  Needs  . Financial resource strain: Not on file  . Food insecurity    Worry: Not on file    Inability: Not on file  . Transportation needs    Medical: Not on file    Non-medical: Not on file  Tobacco Use  . Smoking status: Never Smoker  . Smokeless tobacco: Never Used  Substance and Sexual Activity  . Alcohol use: No  . Drug use: Never  . Sexual activity: Not on file  Lifestyle  . Physical activity    Days per week: Not on file    Minutes per session: Not on file  . Stress: Not on file  Relationships  . Social Herbalist on phone: Not on file    Gets together: Not on file    Attends religious service: Not on file    Active member of club or organization: Not on file    Attends meetings of clubs or organizations: Not on file    Relationship status: Not on file  . Intimate partner violence    Fear of current or ex partner: Not on file    Emotionally abused: Not on file    Physically abused: Not on file    Forced sexual activity: Not on file  Other Topics Concern  . Not on file  Social History Narrative  . Not on file    Family History  Problem Relation Age of Onset  . Stroke Father   . Breast cancer Sister      Current Outpatient Medications:  .  atorvastatin (LIPITOR) 20 MG tablet, Take 20 mg by mouth daily., Disp: , Rfl:  .  cholestyramine (QUESTRAN) 4 g packet, Take 4 g by mouth daily. With water, Disp: , Rfl:  .  dexamethasone (DECADRON) 4 MG tablet, Take 5 tablets (20 mg total) by mouth once a week., Disp: 10 tablet, Rfl: 0 .  ferrous sulfate 325 (65 FE) MG tablet, Take by mouth 2 (two) times daily with a meal. , Disp: , Rfl:  .  lenalidomide (REVLIMID) 5 MG capsule, Take 1 capsule (5 mg total) by mouth daily. Take for 21 days, then hold for 7 days. Repeat every 28 days., Disp: 21 capsule, Rfl: 0 .  magnesium oxide (MAG-OX) 400 MG tablet, Take by mouth., Disp: , Rfl:  .  meclizine (ANTIVERT) 25 MG tablet, Take 1/2 - 1 tablet every 4 hours prn  dizziness or nausea, Disp: , Rfl:  .  metFORMIN (GLUCOPHAGE) 500 MG tablet, Take 500 mg by mouth daily., Disp: , Rfl:  .  metolazone (ZAROXOLYN) 2.5 MG tablet, Take 2.5 mg by mouth daily as needed (feet swelling). , Disp: , Rfl:  .  metoprolol succinate (TOPROL-XL) 25 MG 24 hr tablet, Take 25 mg by mouth at bedtime. , Disp: , Rfl:  .  Multiple Vitamins-Minerals (PRESERVISION AREDS 2) CAPS, Take 1 capsule by mouth 2 (two) times daily., Disp: , Rfl:  .  potassium chloride (K-DUR) 10 MEQ tablet, Take 10 mEq by mouth 2 (two) times daily., Disp: , Rfl:  .  torsemide (DEMADEX) 10 MG tablet, Take 10 mg by mouth daily., Disp: , Rfl:  .  traMADol (ULTRAM)  50 MG tablet, Take 50-100 mg by mouth every 6 (six) hours as needed for pain., Disp: , Rfl:  .  venlafaxine XR (EFFEXOR-XR) 37.5 MG 24 hr capsule, Take 75 mg by mouth daily. , Disp: , Rfl:  .  diclofenac sodium (VOLTAREN) 1 % GEL, Apply 1 application topically as needed., Disp: , Rfl:  .  famotidine (PEPCID) 20 MG tablet, Take 1 tablet (20 mg total) by mouth daily., Disp: 30 tablet, Rfl: 1 .  XARELTO 15 MG TABS tablet, Take 15 mg by mouth daily., Disp: , Rfl:   Physical exam:  Vitals:   02/23/19 0934  BP: (!) 140/93  Pulse: 80  Resp: 18  Temp: 97.7 F (36.5 C)  Weight: 145 lb 11.2 oz (66.1 kg)   Physical Exam Constitutional:      General: She is not in acute distress.    Appearance: She is ill-appearing.     Comments: Sitting in wheelchair  HENT:     Head: Normocephalic and atraumatic.  Eyes:     General: No scleral icterus.    Pupils: Pupils are equal, round, and reactive to light.  Neck:     Musculoskeletal: Normal range of motion and neck supple.  Cardiovascular:     Rate and Rhythm: Normal rate and regular rhythm.     Heart sounds: Normal heart sounds.  Pulmonary:     Effort: Pulmonary effort is normal. No respiratory distress.     Breath sounds: No wheezing.     Comments: Decreased breath sounds left lower lobe Abdominal:      General: Bowel sounds are normal. There is no distension.     Palpations: Abdomen is soft. There is no mass.     Tenderness: There is no abdominal tenderness.  Musculoskeletal: Normal range of motion.        General: No deformity.  Skin:    General: Skin is warm and dry.     Findings: No erythema or rash.  Neurological:     Mental Status: She is alert and oriented to person, place, and time.     Cranial Nerves: No cranial nerve deficit.     Coordination: Coordination normal.  Psychiatric:        Behavior: Behavior normal.        Thought Content: Thought content normal.     CMP Latest Ref Rng & Units 02/23/2019  Glucose 70 - 99 mg/dL 175(H)  BUN 8 - 23 mg/dL 22  Creatinine 0.44 - 1.00 mg/dL 1.17(H)  Sodium 135 - 145 mmol/L 137  Potassium 3.5 - 5.1 mmol/L 3.6  Chloride 98 - 111 mmol/L 101  CO2 22 - 32 mmol/L 27  Calcium 8.9 - 10.3 mg/dL 9.2  Total Protein 6.5 - 8.1 g/dL 7.4  Total Bilirubin 0.3 - 1.2 mg/dL 0.7  Alkaline Phos 38 - 126 U/L 71  AST 15 - 41 U/L 22  ALT 0 - 44 U/L 15   CBC Latest Ref Rng & Units 02/23/2019  WBC 4.0 - 10.5 K/uL 4.8  Hemoglobin 12.0 - 15.0 g/dL 9.4(L)  Hematocrit 36.0 - 46.0 % 30.2(L)  Platelets 150 - 400 K/uL 170    Ct Bone Marrow Biopsy & Aspiration  Result Date: 01/26/2019 INDICATION: Plasma cell dyscrasia EXAM: CT BONE MARROW BIOPSY AND ASPIRATION MEDICATIONS: None. ANESTHESIA/SEDATION: None FLUOROSCOPY TIME:  None COMPLICATIONS: None immediate. PROCEDURE: Informed written consent was obtained from the patient after a thorough discussion of the procedural risks, benefits and alternatives. All questions were addressed. Maximal Sterile Barrier  Technique was utilized including caps, mask, sterile gowns, sterile gloves, sterile drape, hand hygiene and skin antiseptic. A timeout was performed prior to the initiation of the procedure. Under CT guidance, a(n) 11 gauge guide needle was advanced into the right iliac bone via posterior approach. Aspirates  and a core were obtained. Post biopsy images demonstrate no hemorrhage. FINDINGS: Imaging confirms needle placement in the right iliac bone. Post biopsy images demonstrate no evidence of hemorrhage. IMPRESSION: Successful CT-guided bone marrow aspirate and core. Electronically Signed   By: Marybelle Killings M.D.   On: 01/26/2019 09:45     Assessment and plan  1. Multiple myeloma not having achieved remission (Cassie Huffman)   2. Current chronic use of systemic steroids   3. Goals of care, counseling/discussion   4. CKD (chronic kidney disease) stage 3, GFR 30-59 ml/min (HCC)   5. Normocytic anemia   6. Encounter for antineoplastic chemotherapy    #.  Labs are reviewed discussed with patient and daughter. Patient has tolerated single agent weekly dexamethasone 20 mg so far. We are seeing improvement of patient's hemoglobin to 9.4, her kidney function also improved as well. Recommend patient to start Revlimid 5 mg day 1-21 every 28 days, continue dexamethasone 20 mg weekly.  Pepcid 20 mg daily for GI prophylaxis. Follow-up in 2 weeks for assessment of treatment tolerance. Patient is on chronic anticoagulation with Xarelto.  Discussed with patient and daughter that hiccups can be secondary to dexamethasone.  Symptoms are transient.  Continue to monitor. Goal of care was discussed.  Orders Placed This Encounter  Procedures  . CBC with Differential/Platelet    Standing Status:   Future    Standing Expiration Date:   02/23/2020  . Comprehensive metabolic panel    Standing Status:   Future    Standing Expiration Date:   02/23/2020      Follow up in 2 weeks.  We spent sufficient time to discuss many aspect of care, questions were answered to patient's satisfaction. Total face to face encounter time for this patient visit was 25 min. >50% of the time was  spent in counseling and coordination of care.   Earlie Server, MD, PhD 02/23/2019  02/24/2019 Addendum Daughter called and reported that patient has blood  in stool, also had 3 loose bowel movements today. Some nausea, no vomiting.  Advise patient to hold revlimid, encourage oral hydration with electrolytes water. Zofran 60m Q8h PRN.  Reviewed medication, she is also on Mag Ox. Advise pt to stop mag ox which can worsen diarrhea.  Start Slow Mag 1 tab daily.  Anti diarrhea imodium Rx sent to pharmacy.   ZMargy Clarks

## 2019-02-24 LAB — BETA 2 MICROGLOBULIN, SERUM: Beta-2 Microglobulin: 4.2 mg/L — ABNORMAL HIGH (ref 0.6–2.4)

## 2019-02-24 MED ORDER — MAGNESIUM CHLORIDE 64 MG PO TBEC: 1.0000 | DELAYED_RELEASE_TABLET | Freq: Every day | ORAL | 3 refills | Status: DC

## 2019-02-24 MED ORDER — LOPERAMIDE HCL 2 MG PO CAPS: 2.0000 mg | ORAL_CAPSULE | ORAL | 1 refills | Status: DC

## 2019-02-24 MED ORDER — ONDANSETRON HCL 8 MG PO TABS: 8.0000 mg | ORAL_TABLET | Freq: Three times a day (TID) | ORAL | 2 refills | Status: DC | PRN

## 2019-02-24 NOTE — Addendum Note (Signed)
Addended by: Earlie Server on: 02/24/2019 08:54 PM   Modules accepted: Orders

## 2019-02-27 NOTE — Telephone Encounter (Signed)
Oral Chemotherapy Pharmacist Encounter  Patient Education I spoke with patient's daughter Jimmy Picket for overview of new oral chemotherapy medication in the cancer center parking lot ahead of her mother's appt. [place] appt limited to appt only due to COVID-19 precautions.   Revlimid (lenalidomide) will be used for the treatment of plasma cell myeloma in conjunction with dexamethasone, planned duration until disease progression or unacceptable drug toxicity. She is the main caregiver for the patient   Counseled Cathy on administration, dosing, side effects, monitoring, drug-food interactions, safe handling, storage, and disposal. Patient will take 1 capsule (5 mg total) by mouth daily. Take for 21 days, then hold for 7 days. Repeat every 28 days.  Side effects include but not limited to: decreased wbc/plt/hgb, fatigue, rash, N/V.    Reviewed with patient importance of keeping a medication schedule and plan for any missed doses.  Ms. Gossett voiced understanding and appreciation. All questions answered. Medication handout and calendar provided.   Provided patient with Oral [facility] phone number. Patient knows to call the office with questions or concerns. Oral Chemotherapy [facility] will continue to follow.  Darl Pikes, PharmD, BCPS, [facility] Hematology/Oncology Clinical Pharmacist [facility]/AP [facility] [place] [phone]  02/27/2019 3:58 PM

## 2019-03-02 ENCOUNTER — Other Ambulatory Visit: Payer: Self-pay

## 2019-03-02 ENCOUNTER — Emergency Department: Payer: Medicare Other

## 2019-03-02 ENCOUNTER — Inpatient Hospital Stay
Admission: EM | Admit: 2019-03-02 | Discharge: 2019-03-06 | DRG: 640 | Disposition: A | Discharge status: Hospice | Payer: Medicare Other | Attending: Internal Medicine | Admitting: Internal Medicine

## 2019-03-02 DIAGNOSIS — E119 Type 2 diabetes mellitus without complications: Secondary | ICD-10-CM | POA: Diagnosis present

## 2019-03-02 DIAGNOSIS — D638 Anemia in other chronic diseases classified elsewhere: Secondary | ICD-10-CM | POA: Diagnosis present

## 2019-03-02 DIAGNOSIS — Z992 Dependence on renal dialysis: Secondary | ICD-10-CM | POA: Diagnosis not present

## 2019-03-02 DIAGNOSIS — N179 Acute kidney failure, unspecified: Secondary | ICD-10-CM | POA: Diagnosis present

## 2019-03-02 DIAGNOSIS — R4182 Altered mental status, unspecified: Secondary | ICD-10-CM

## 2019-03-02 DIAGNOSIS — I509 Heart failure, unspecified: Secondary | ICD-10-CM | POA: Diagnosis present

## 2019-03-02 DIAGNOSIS — E876 Hypokalemia: Secondary | ICD-10-CM | POA: Diagnosis present

## 2019-03-02 DIAGNOSIS — E872 Acidosis: Secondary | ICD-10-CM | POA: Diagnosis present

## 2019-03-02 DIAGNOSIS — Z1159 Encounter for screening for other viral diseases: Secondary | ICD-10-CM | POA: Diagnosis not present

## 2019-03-02 DIAGNOSIS — J9601 Acute respiratory failure with hypoxia: Secondary | ICD-10-CM | POA: Diagnosis present

## 2019-03-02 DIAGNOSIS — Z515 Encounter for palliative care: Secondary | ICD-10-CM

## 2019-03-02 DIAGNOSIS — Z7901 Long term (current) use of anticoagulants: Secondary | ICD-10-CM

## 2019-03-02 DIAGNOSIS — K429 Umbilical hernia without obstruction or gangrene: Secondary | ICD-10-CM | POA: Diagnosis present

## 2019-03-02 DIAGNOSIS — G92 Toxic encephalopathy: Secondary | ICD-10-CM | POA: Diagnosis present

## 2019-03-02 DIAGNOSIS — Z66 Do not resuscitate: Secondary | ICD-10-CM | POA: Diagnosis present

## 2019-03-02 DIAGNOSIS — C50919 Malignant neoplasm of unspecified site of unspecified female breast: Secondary | ICD-10-CM | POA: Diagnosis present

## 2019-03-02 DIAGNOSIS — I110 Hypertensive heart disease with heart failure: Secondary | ICD-10-CM | POA: Diagnosis present

## 2019-03-02 DIAGNOSIS — I482 Chronic atrial fibrillation, unspecified: Secondary | ICD-10-CM | POA: Diagnosis present

## 2019-03-02 DIAGNOSIS — E860 Dehydration: Secondary | ICD-10-CM | POA: Diagnosis present

## 2019-03-02 DIAGNOSIS — M8580 Other specified disorders of bone density and structure, unspecified site: Secondary | ICD-10-CM | POA: Diagnosis present

## 2019-03-02 DIAGNOSIS — Z79899 Other long term (current) drug therapy: Secondary | ICD-10-CM

## 2019-03-02 DIAGNOSIS — Z823 Family history of stroke: Secondary | ICD-10-CM | POA: Diagnosis not present

## 2019-03-02 DIAGNOSIS — Z7189 Other specified counseling: Secondary | ICD-10-CM | POA: Diagnosis not present

## 2019-03-02 DIAGNOSIS — C9000 Multiple myeloma not having achieved remission: Secondary | ICD-10-CM | POA: Diagnosis present

## 2019-03-02 DIAGNOSIS — J189 Pneumonia, unspecified organism: Secondary | ICD-10-CM | POA: Diagnosis present

## 2019-03-02 DIAGNOSIS — Z7984 Long term (current) use of oral hypoglycemic drugs: Secondary | ICD-10-CM

## 2019-03-02 DIAGNOSIS — Z803 Family history of malignant neoplasm of breast: Secondary | ICD-10-CM

## 2019-03-02 DIAGNOSIS — Z853 Personal history of malignant neoplasm of breast: Secondary | ICD-10-CM

## 2019-03-02 DIAGNOSIS — Z79891 Long term (current) use of opiate analgesic: Secondary | ICD-10-CM

## 2019-03-02 HISTORY — DX: Multiple myeloma not having achieved remission: C90.00

## 2019-03-02 HISTORY — DX: Malignant neoplasm of unspecified site of unspecified female breast: C50.919

## 2019-03-02 LAB — COMPREHENSIVE METABOLIC PANEL
ALT: 13 U/L (ref 0–44)
AST: 26 U/L (ref 15–41)
Albumin: 3.2 g/dL — ABNORMAL LOW (ref 3.5–5.0)
Alkaline Phosphatase: 69 U/L (ref 38–126)
Anion gap: 11 (ref 5–15)
BUN: 43 mg/dL — ABNORMAL HIGH (ref 8–23)
CO2: 35 mmol/L — ABNORMAL HIGH (ref 22–32)
Calcium: >15 mg/dL (ref 8.9–10.3)
Chloride: 99 mmol/L (ref 98–111)
Creatinine, Ser: 1.43 mg/dL — ABNORMAL HIGH (ref 0.44–1.00)
GFR calc Af Amer: 37 mL/min — ABNORMAL LOW (ref >60)
GFR calc non Af Amer: 32 mL/min — ABNORMAL LOW (ref >60)
Glucose, Bld: 134 mg/dL — ABNORMAL HIGH (ref 70–99)
Potassium: 2.6 mmol/L — CL (ref 3.5–5.1)
Sodium: 145 mmol/L (ref 135–145)
Total Bilirubin: 1.9 mg/dL — ABNORMAL HIGH (ref 0.3–1.2)
Total Protein: 7 g/dL (ref 6.5–8.1)

## 2019-03-02 LAB — GLUCOSE, CAPILLARY
Glucose-Capillary: 112 mg/dL — ABNORMAL HIGH (ref 70–99)
Glucose-Capillary: 116 mg/dL — ABNORMAL HIGH (ref 70–99)

## 2019-03-02 LAB — URINALYSIS, COMPLETE (UACMP) WITH MICROSCOPIC
Bacteria, UA: NONE SEEN
Bilirubin Urine: NEGATIVE
Glucose, UA: NEGATIVE mg/dL
Hgb urine dipstick: NEGATIVE
Ketones, ur: NEGATIVE mg/dL
Nitrite: NEGATIVE
Protein, ur: NEGATIVE mg/dL
Specific Gravity, Urine: 1.012 (ref 1.005–1.030)
pH: 5 (ref 5.0–8.0)

## 2019-03-02 LAB — CALCIUM: Calcium: >15 mg/dL (ref 8.9–10.3)

## 2019-03-02 LAB — CBC
HCT: 33.9 % — ABNORMAL LOW (ref 36.0–46.0)
Hemoglobin: 10.3 g/dL — ABNORMAL LOW (ref 12.0–15.0)
MCH: 32.1 pg (ref 26.0–34.0)
MCHC: 30.4 g/dL (ref 30.0–36.0)
MCV: 105.6 fL — ABNORMAL HIGH (ref 80.0–100.0)
Platelets: 221 10*3/uL (ref 150–400)
RBC: 3.21 MIL/uL — ABNORMAL LOW (ref 3.87–5.11)
RDW: 19.9 % — ABNORMAL HIGH (ref 11.5–15.5)
WBC: 9.9 10*3/uL (ref 4.0–10.5)
nRBC: 0.6 % — ABNORMAL HIGH (ref 0.0–0.2)

## 2019-03-02 LAB — LACTIC ACID, PLASMA
Lactic Acid, Venous: 2.8 mmol/L (ref 0.5–1.9)
Lactic Acid, Venous: 3 mmol/L (ref 0.5–1.9)

## 2019-03-02 LAB — MAGNESIUM: Magnesium: 1.5 mg/dL — ABNORMAL LOW (ref 1.7–2.4)

## 2019-03-02 LAB — SARS CORONAVIRUS 2 BY RT PCR (HOSPITAL ORDER, PERFORMED IN ~~LOC~~ HOSPITAL LAB): SARS Coronavirus 2: NEGATIVE

## 2019-03-02 MED ORDER — SODIUM CHLORIDE 0.9 % IV SOLN
500.0000 mg | Freq: Once | INTRAVENOUS | Status: AC
  Administered 2019-03-02: 12:00:00 500 mg via INTRAVENOUS
  Filled 2019-03-02: qty 500

## 2019-03-02 MED ORDER — INSULIN ASPART 100 UNIT/ML ~~LOC~~ SOLN: 0.0000 [IU] | Freq: Three times a day (TID) | SUBCUTANEOUS | Status: DC

## 2019-03-02 MED ORDER — INSULIN ASPART 100 UNIT/ML ~~LOC~~ SOLN: 0.0000 [IU] | Freq: Every day | SUBCUTANEOUS | Status: DC

## 2019-03-02 MED ORDER — SODIUM CHLORIDE 0.9 % IV SOLN
INTRAVENOUS | Status: DC
  Administered 2019-03-02 – 2019-03-03 (×2): via INTRAVENOUS

## 2019-03-02 MED ORDER — POTASSIUM CHLORIDE CRYS ER 10 MEQ PO TBCR
10.0000 meq | EXTENDED_RELEASE_TABLET | Freq: Two times a day (BID) | ORAL | Status: DC
  Filled 2019-03-02 (×3): qty 1

## 2019-03-02 MED ORDER — SODIUM CHLORIDE 0.9 % IV BOLUS
1000.0000 mL | Freq: Once | INTRAVENOUS | Status: AC
  Administered 2019-03-02: 1000 mL via INTRAVENOUS

## 2019-03-02 MED ORDER — FERROUS SULFATE 325 (65 FE) MG PO TABS: 325.0000 mg | ORAL_TABLET | Freq: Two times a day (BID) | ORAL | Status: DC

## 2019-03-02 MED ORDER — SENNOSIDES-DOCUSATE SODIUM 8.6-50 MG PO TABS: 1.0000 | ORAL_TABLET | Freq: Every evening | ORAL | Status: DC | PRN

## 2019-03-02 MED ORDER — LOPERAMIDE HCL 2 MG PO CAPS: 2.0000 mg | ORAL_CAPSULE | ORAL | Status: DC

## 2019-03-02 MED ORDER — BISACODYL 5 MG PO TBEC: 5.0000 mg | DELAYED_RELEASE_TABLET | Freq: Every day | ORAL | Status: DC | PRN

## 2019-03-02 MED ORDER — CALCITONIN (SALMON) 200 UNIT/ML IJ SOLN
4.0000 [IU]/kg | Freq: Two times a day (BID) | INTRAMUSCULAR | Status: AC
  Administered 2019-03-02 – 2019-03-03 (×4): 282 [IU] via INTRAMUSCULAR
  Filled 2019-03-02 (×6): qty 1.41

## 2019-03-02 MED ORDER — FAMOTIDINE 20 MG PO TABS: 20.0000 mg | ORAL_TABLET | Freq: Every day | ORAL | Status: DC

## 2019-03-02 MED ORDER — HYDROCODONE-ACETAMINOPHEN 5-325 MG PO TABS: 1.0000 | ORAL_TABLET | ORAL | Status: DC | PRN

## 2019-03-02 MED ORDER — METOPROLOL SUCCINATE ER 25 MG PO TB24: 25.0000 mg | ORAL_TABLET | Freq: Every day | ORAL | Status: DC

## 2019-03-02 MED ORDER — ACETAMINOPHEN 325 MG PO TABS: 650.0000 mg | ORAL_TABLET | Freq: Four times a day (QID) | ORAL | Status: DC | PRN

## 2019-03-02 MED ORDER — MAGNESIUM CHLORIDE 64 MG PO TBEC
1.0000 | DELAYED_RELEASE_TABLET | Freq: Every day | ORAL | Status: DC
  Filled 2019-03-02 (×3): qty 1

## 2019-03-02 MED ORDER — SODIUM CHLORIDE 0.9 % IV SOLN
2.0000 g | INTRAVENOUS | Status: DC
  Administered 2019-03-03 – 2019-03-05 (×3): 2 g via INTRAVENOUS
  Filled 2019-03-02 (×2): qty 2
  Filled 2019-03-02: qty 20
  Filled 2019-03-02: qty 2
  Filled 2019-03-02: qty 20

## 2019-03-02 MED ORDER — ACETAMINOPHEN 650 MG RE SUPP: 650.0000 mg | Freq: Four times a day (QID) | RECTAL | Status: DC | PRN

## 2019-03-02 MED ORDER — ONDANSETRON HCL 4 MG PO TABS: 4.0000 mg | ORAL_TABLET | Freq: Four times a day (QID) | ORAL | Status: DC | PRN

## 2019-03-02 MED ORDER — SODIUM CHLORIDE 0.9 % IV SOLN
500.0000 mg | INTRAVENOUS | Status: DC
  Administered 2019-03-03 – 2019-03-05 (×3): 500 mg via INTRAVENOUS
  Filled 2019-03-02 (×4): qty 500

## 2019-03-02 MED ORDER — SODIUM CHLORIDE 0.9 % IV BOLUS
1000.0000 mL | Freq: Once | INTRAVENOUS | Status: AC
  Administered 2019-03-02: 12:00:00 1000 mL via INTRAVENOUS

## 2019-03-02 MED ORDER — CHOLESTYRAMINE 4 G PO PACK
4.0000 g | PACK | Freq: Every day | ORAL | Status: DC
  Filled 2019-03-02 (×4): qty 1

## 2019-03-02 MED ORDER — ONDANSETRON HCL 4 MG/2ML IJ SOLN: 4.0000 mg | Freq: Four times a day (QID) | INTRAMUSCULAR | Status: DC | PRN

## 2019-03-02 MED ORDER — VENLAFAXINE HCL ER 75 MG PO CP24: 75.0000 mg | ORAL_CAPSULE | Freq: Every day | ORAL | Status: DC

## 2019-03-02 MED ORDER — ZOLEDRONIC ACID 4 MG/5ML IV CONC
3.0000 mg | Freq: Once | INTRAVENOUS | Status: AC
  Administered 2019-03-02: 13:00:00 3 mg via INTRAVENOUS
  Filled 2019-03-02: qty 3.75

## 2019-03-02 MED ORDER — GUAIFENESIN-DM 100-10 MG/5ML PO SYRP: 5.0000 mL | ORAL_SOLUTION | ORAL | Status: DC | PRN

## 2019-03-02 MED ORDER — SODIUM CHLORIDE 0.9% FLUSH
3.0000 mL | Freq: Once | INTRAVENOUS | Status: AC
  Administered 2019-03-02: 11:00:00 3 mL via INTRAVENOUS

## 2019-03-02 MED ORDER — RIVAROXABAN 15 MG PO TABS
15.0000 mg | ORAL_TABLET | Freq: Every day | ORAL | Status: DC
  Filled 2019-03-02 (×3): qty 1

## 2019-03-02 MED ORDER — ALBUTEROL SULFATE (2.5 MG/3ML) 0.083% IN NEBU: 2.5000 mg | INHALATION_SOLUTION | RESPIRATORY_TRACT | Status: DC | PRN

## 2019-03-02 MED ORDER — DEXAMETHASONE 4 MG PO TABS
20.0000 mg | ORAL_TABLET | ORAL | Status: DC
  Filled 2019-03-02: qty 5

## 2019-03-02 MED ORDER — OCUVITE-LUTEIN PO CAPS
1.0000 | ORAL_CAPSULE | Freq: Two times a day (BID) | ORAL | Status: DC
  Filled 2019-03-02 (×9): qty 1

## 2019-03-02 MED ORDER — LOPERAMIDE HCL 2 MG PO CAPS: 4.0000 mg | ORAL_CAPSULE | Freq: Once | ORAL | Status: DC

## 2019-03-02 MED ORDER — SODIUM CHLORIDE 0.9 % IV SOLN
1.0000 g | Freq: Once | INTRAVENOUS | Status: AC
  Administered 2019-03-02: 1 g via INTRAVENOUS
  Filled 2019-03-02: qty 10

## 2019-03-02 MED ORDER — POTASSIUM CHLORIDE 10 MEQ/100ML IV SOLN
10.0000 meq | INTRAVENOUS | Status: AC
  Administered 2019-03-02 (×3): 10 meq via INTRAVENOUS
  Filled 2019-03-02 (×4): qty 100

## 2019-03-02 MED ORDER — MAGNESIUM SULFATE 2 GM/50ML IV SOLN
2.0000 g | Freq: Once | INTRAVENOUS | Status: AC
  Administered 2019-03-02: 17:00:00 2 g via INTRAVENOUS
  Filled 2019-03-02: qty 50

## 2019-03-02 NOTE — H&P (Signed)
[place] [facility] [place] NAME: Cassie Huffman    MR#:  [id number]  DATE OF BIRTH:  07/09/1927  DATE OF ADMISSION:  03/02/2019  PRIMARY CARE PHYSICIAN: Kirk Ruths, MD   REQUESTING/REFERRING PHYSICIAN: Dr. Archie Balboa.  CHIEF COMPLAINT:   Chief Complaint  Patient presents with  . Altered Mental Status   Confusion for 2 days. HISTORY OF PRESENT ILLNESS:  Cassie Huffman  is a 85 y.o. female with a known history of A. fib, breast cancer, multiple myeloma, CHF, hypertension, diabetes and umbilical hernia.  The patient is sent from home to ED due to above chief complaints.  According to patient's daughter, the patient was fine until 2 days ago.  She become confused which has been worsening for the past 2 days.  She had diagnosis of multiple myeloma and started chemotherapy with lenalidomide 1 week ago.  The patient is found causing more than 15, potassium 2.6 and renal failure.  She is treated with Zometa IV 1 dose and IV fluid in ED.  Per Dr. Archie Balboa, Dr. Holley Raring suggest follow-up calcium level and dialysis PRN. PAST MEDICAL HISTORY:   Past Medical History:  Diagnosis Date  . A-fib ([place])   . Breast cancer ([place])   . Cancer ([place])   . CHF (congestive heart failure) ([place])   . Diabetes mellitus without complication ([place])   . Hypertension   . Multiple myeloma ([place])   . Umbilical hernia     PAST SURGICAL HISTORY:   Past Surgical History:  Procedure Laterality Date  . bilateral breast surgery  2000    SOCIAL HISTORY:   Social History   Tobacco Use  . Smoking status: Never Smoker  . Smokeless tobacco: Never Used  Substance Use Topics  . Alcohol use: No    FAMILY HISTORY:   Family History  Problem Relation Age of Onset  . Stroke Father   . Breast cancer Sister     DRUG ALLERGIES:   Allergies  Allergen Reactions  . Tape Hives    Pt states that she gets whelps that spread out from the Bandaid Pt states that she gets whelps  that spread out from the Bandaid Pt states that she gets whelps that spread out from the Bandaid Pt states that she gets whelps that spread out from the Bandaid Pt states that she gets whelps that spread out from the [place]:   Review of Systems  Unable to perform ROS: Mental status change    MEDICATIONS AT HOME:   Prior to Admission medications   Medication Sig Start Date End Date Taking? Authorizing Provider  atorvastatin (LIPITOR) 20 MG tablet Take 20 mg by mouth daily. 01/27/16   [provider]  cholestyramine (QUESTRAN) 4 g packet Take 4 g by mouth daily. With water 09/23/18   [provider]  dexamethasone (DECADRON) 4 MG tablet Take 5 tablets (20 mg total) by mouth once a week. 02/23/19   Earlie Server, MD  diclofenac sodium (VOLTAREN) 1 % GEL Apply 1 application topically as needed. 04/21/18   [provider]  famotidine (PEPCID) 20 MG tablet Take 1 tablet (20 mg total) by mouth daily. 02/23/19   Earlie Server, MD  ferrous sulfate 325 (65 FE) MG tablet Take by mouth 2 (two) times daily with a meal.  10/12/18 10/12/19  [provider]  lenalidomide (REVLIMID) 5 MG capsule Take 1 capsule (5 mg total) by mouth daily. Take for 21  days, then hold for 7 days. Repeat every 28 days. 02/14/19   Earlie Server, MD  loperamide (IMODIUM) 2 MG capsule Take 1 capsule (2 mg total) by mouth See admin instructions. With onset of loose stool, take 62m followed by 272mevery 2 hours until 12 hours have passed without loose bowel movement. Maximum: 16 mg/day 02/24/19   YuEarlie ServerMD  magnesium chloride (SLOW-MAG) 64 MG TBEC SR tablet Take 1 tablet (64 mg total) by mouth daily. 02/24/19   YuEarlie ServerMD  meclizine (ANTIVERT) 25 MG tablet Take 1/2 - 1 tablet every 4 hours prn dizziness or nausea 11/09/18   [provider]  metFORMIN (GLUCOPHAGE) 500 MG tablet Take 500 mg by mouth daily. 09/30/18   [provider]  metolazone (ZAROXOLYN) 2.5 MG tablet Take 2.5  mg by mouth daily as needed (feet swelling).  09/22/18 09/22/19  [provider]  metoprolol succinate (TOPROL-XL) 25 MG 24 hr tablet Take 25 mg by mouth at bedtime.  10/26/16   [provider]  Multiple Vitamins-Minerals (PRESERVISION AREDS 2) CAPS Take 1 capsule by mouth 2 (two) times daily.    [provider]  ondansetron (ZOFRAN) 8 MG tablet Take 1 tablet (8 mg total) by mouth every 8 (eight) hours as needed for nausea, vomiting or refractory nausea / vomiting. 02/24/19   YuEarlie ServerMD  potassium chloride (K-DUR) 10 MEQ tablet Take 10 mEq by mouth 2 (two) times daily. 09/07/18   [provider]  torsemide (DEMADEX) 10 MG tablet Take 10 mg by mouth daily. 06/22/18 06/22/19  [provider]  traMADol (ULTRAM) 50 MG tablet Take 50-100 mg by mouth every 6 (six) hours as needed for pain. 03/01/18   [provider]  venlafaxine XR (EFFEXOR-XR) 37.5 MG 24 hr capsule Take 75 mg by mouth daily.  09/23/18   [provider]  XARELTO 15 MG TABS tablet Take 15 mg by mouth daily. 11/04/16   [provider]      VITAL SIGNS:  Blood pressure (!) 142/49, pulse 67, temperature 98.4 F (36.9 C), temperature source Rectal, resp. rate (!) 22, height 4' 11"  (1.499 m), weight 70.3 kg, SpO2 100 %.  PHYSICAL EXAMINATION:  Physical Exam  GENERAL:  [age].o.-year-old patient lying in the bed with no acute distress.  Looks fragile. EYES: Pupils equal, round, reactive to light and accommodation. No scleral icterus. Extraocular muscles intact.  HEENT: Head atraumatic, normocephalic. NECK:  Supple, no jugular venous distention. No thyroid enlargement, no tenderness.  LUNGS: Normal breath sounds bilaterally, no wheezing, rales,rhonchi or crepitation. No use of accessory muscles of respiration.  CARDIOVASCULAR: S1, S2 normal. No murmurs, rubs, or gallops.  ABDOMEN: Soft, diffuse tenderness, nondistended. Bowel sounds present. No organomegaly or mass.   EXTREMITIES: No pedal edema, cyanosis, or clubbing.  NEUROLOGIC: Not follow commands. PSYCHIATRIC: The patient is confused and lethargic.. Marland KitchenSKIN: No obvious rash, lesion, or ulcer.   LABORATORY PANEL:   CBC Recent Labs  Lab 03/02/19 1005  WBC 9.9  HGB 10.3*  HCT 33.9*  PLT 221   ------------------------------------------------------------------------------------------------------------------  Chemistries  Recent Labs  Lab 03/02/19 1105  NA 145  K 2.6*  CL 99  CO2 35*  GLUCOSE 134*  BUN 43*  CREATININE 1.43*  CALCIUM >15.0*  AST 26  ALT 13  ALKPHOS 69  BILITOT 1.9*   ------------------------------------------------------------------------------------------------------------------  Cardiac Enzymes No results for input(s): TROPONINI in the last 168 hours. ------------------------------------------------------------------------------------------------------------------  RADIOLOGY:  Dg Chest Portable 1 View  Result  Date: 03/02/2019 CLINICAL DATA:  Hypoxia, dementia EXAM: PORTABLE CHEST 1 VIEW COMPARISON:  10/06/2018 FINDINGS: Increased patchy bronchovascular airspace process in the left lower lobe obscures the left hemidiaphragm may represent pneumonia. Stable cardiomegaly and chronic bronchitic change/interstitial prominence. No current CHF. Small left effusion not excluded. Right lung grossly clear. Negative for pneumothorax. Trachea midline. Aorta atherosclerotic and degenerative changes noted of the spine and shoulders. Healed deformity of the right proximal humerus. IMPRESSION: Increased left lower lobe bronchovascular airspace process concerning for pneumonia and likely small left effusion. Cardiomegaly Electronically Signed   By: Jerilynn Mages.  Shick M.D.   On: 03/02/2019 11:01      IMPRESSION AND PLAN:   Severe hypercalcemia. The patient will be admitted to medical floor. She was given 1 dose IV Zometa, continue IV fluid support, follow-up calcium level. Hemodialysis PRN  per Dr. Holley Raring. Severe hypokalemia.  Potassium IV, follow-up potassium and magnesium level. Acute renal failure due to dehydration.  IV fluid support and follow-up BMP.  Hold torsemide and metolazone. Pneumonia, CAP.  Continue Zithromax and Rocephin, Robitussin as needed. Acute metabolic encephalopathy due to above.  Aspiration and fall precaution. Multiple myeloma.  Hold chemo medication this time. Anemia of chronic disease.  Stable. History of chronic A. fib.  Rate controlled, continue Xarelto and Lopressor. Hypertension, continue Lopressor but hold torsemide and metolazone. Diabetes.  Start sliding scale, hold metformin. Very poor prognosis.  Palliative care consult. All the records are reviewed and case discussed with ED provider. Management plans discussed with the patient's daughter, and they are in agreement.  CODE STATUS: DNR.  TOTAL TIME TAKING CARE OF THIS PATIENT: 46 minutes.    Demetrios Loll M.D on 03/02/2019 at 1:15 PM  Between 7am to 6pm - Pager - [phone]  After 6pm go to [email or web address] - [employer] [facility]  Office  [phone]  CC: Primary care physician; Kirk Ruths, MD   Note: This dictation was prepared with Dragon dictation along with smaller phrase technology. Any transcriptional errors that result from this process are unin

## 2019-03-02 NOTE — ED Notes (Signed)
Green top redrawn and sent to lab. 

## 2019-03-02 NOTE — Progress Notes (Signed)
Dr. Bridgett Larsson made aware that repeat Ca remains > 15

## 2019-03-02 NOTE — ED Notes (Signed)
Pt was responsive verbally with IM injection.

## 2019-03-02 NOTE — ED Notes (Signed)
Pt daughter [name] is at the bedside, states the pt normally has dementia but will carry on a conversation, walks with a walker. States she just recently started taking a low daily dose of oral chemotherapy for multiple myeloma.Marland Kitchen

## 2019-03-02 NOTE — ED Triage Notes (Signed)
Pt comes into the ED via EMS from home, EMS reports pt lives with daughter and states pt has dementia at baseline, but for the past 3 days had a decline in mental status, was dx with a UTI and was given one dose of abx. Pt mucous membranes are dry, some yellow skin color noted on arrival. Pt is not follow commands, is not opening eyes, grunts and grimaces with movement or stimulus. Respirations WNL.low o2 sats 82% , pt placed on 3L [place]

## 2019-03-02 NOTE — Progress Notes (Signed)
Pt's daughter, POA at bedside to assist with [facility]

## 2019-03-02 NOTE — ED Provider Notes (Signed)
[facility] Emergency Department Provider Note ____________________________________________   I have reviewed the triage vital signs and the nursing notes.   HISTORY  Chief Complaint Altered Mental Status   History limited by and level 5 caveat due to: AMS   HPI Cassie Huffman is a 85 y.o. female who presents to the emergency department today because of concerns for altered mental status.  Daughter states that the patient has been slightly more altered over the past couple days.  Today was significantly worse.  Patient herself cannot give any history.  Daughter states that she does have history of multiple myeloma and is on therapy for that.    Records reviewed. Per medical record review patient has a history of multiple myeloma  Past Medical History:  Diagnosis Date  . A-fib ([place])   . Cancer ([place])   . CHF (congestive heart failure) ([place])   . Diabetes mellitus without complication ([place])   . Hypertension   . Umbilical hernia     Patient Active Problem List   Diagnosis Date Noted  . Gastroesophageal reflux disease 02/23/2019  . Multiple myeloma ([place]) 02/10/2019  . Goals of care, counseling/discussion 02/10/2019  . CKD (chronic kidney disease) stage 3, GFR 30-59 ml/min (HCC) 02/10/2019  . Normocytic anemia 02/10/2019    Past Surgical History:  Procedure Laterality Date  . bilateral breast surgery  2000    Prior to Admission medications   Medication Sig Start Date End Date Taking? Authorizing Provider  atorvastatin (LIPITOR) 20 MG tablet Take 20 mg by mouth daily. 01/27/16   [provider]  cholestyramine (QUESTRAN) 4 g packet Take 4 g by mouth daily. With water 09/23/18   [provider]  dexamethasone (DECADRON) 4 MG tablet Take 5 tablets (20 mg total) by mouth once a week. 02/23/19   Earlie Server, MD  diclofenac sodium (VOLTAREN) 1 % GEL Apply 1 application topically as needed. 04/21/18   [provider]  famotidine (PEPCID)  20 MG tablet Take 1 tablet (20 mg total) by mouth daily. 02/23/19   Earlie Server, MD  ferrous sulfate 325 (65 FE) MG tablet Take by mouth 2 (two) times daily with a meal.  10/12/18 10/12/19  [provider]  lenalidomide (REVLIMID) 5 MG capsule Take 1 capsule (5 mg total) by mouth daily. Take for 21 days, then hold for 7 days. Repeat every 28 days. 02/14/19   Earlie Server, MD  loperamide (IMODIUM) 2 MG capsule Take 1 capsule (2 mg total) by mouth See admin instructions. With onset of loose stool, take 70m followed by 261mevery 2 hours until 12 hours have passed without loose bowel movement. Maximum: 16 mg/day 02/24/19   YuEarlie ServerMD  magnesium chloride (SLOW-MAG) 64 MG TBEC SR tablet Take 1 tablet (64 mg total) by mouth daily. 02/24/19   YuEarlie ServerMD  meclizine (ANTIVERT) 25 MG tablet Take 1/2 - 1 tablet every 4 hours prn dizziness or nausea 11/09/18   [provider]  metFORMIN (GLUCOPHAGE) 500 MG tablet Take 500 mg by mouth daily. 09/30/18   [provider]  metolazone (ZAROXOLYN) 2.5 MG tablet Take 2.5 mg by mouth daily as needed (feet swelling).  09/22/18 09/22/19  [provider]  metoprolol succinate (TOPROL-XL) 25 MG 24 hr tablet Take 25 mg by mouth at bedtime.  10/26/16   [provider]  Multiple Vitamins-Minerals (PRESERVISION AREDS 2) CAPS Take 1 capsule by mouth 2 (two) times daily.    [provider]  ondansetron ([facility]  8 MG tablet Take 1 tablet (8 mg total) by mouth every 8 (eight) hours as needed for nausea, vomiting or refractory nausea / vomiting. 02/24/19   Earlie Server, MD  potassium chloride (K-DUR) 10 MEQ tablet Take 10 mEq by mouth 2 (two) times daily. 09/07/18   [provider]  torsemide (DEMADEX) 10 MG tablet Take 10 mg by mouth daily. 06/22/18 06/22/19  [provider]  traMADol (ULTRAM) 50 MG tablet Take 50-100 mg by mouth every 6 (six) hours as needed for pain. 03/01/18   [provider]  venlafaxine XR (EFFEXOR-XR) 37.5  MG 24 hr capsule Take 75 mg by mouth daily.  09/23/18   [provider]  XARELTO 15 MG TABS tablet Take 15 mg by mouth daily. 11/04/16   [provider]    Allergies Tape  Family History  Problem Relation Age of Onset  . Stroke Father   . Breast cancer Sister     Social History Social History   Tobacco Use  . Smoking status: Never Smoker  . Smokeless tobacco: Never Used  Substance Use Topics  . Alcohol use: No  . Drug use: Never    Review of Systems Unable to obtain secondary to altered mental status ____________________________________________   PHYSICAL EXAM:  VITAL SIGNS: ED Triage Vitals  Enc Vitals Group     BP 03/02/19 0956 132/81     Pulse Rate 03/02/19 0956 72     Resp 03/02/19 0956 17     Temp --      Temp Source 03/02/19 0956 Oral     SpO2 03/02/19 0956 (!) 82 %     Weight 03/02/19 0957 155 lb (70.3 kg)     Height 03/02/19 0957 4' 11"  (1.499 m)   Constitutional: Somnolent, mumbling Eyes: Conjunctivae are normal.  ENT      Head: Normocephalic and atraumatic.      Nose: No congestion/rhinnorhea.      Mouth/Throat: Mucous membranes are moist.      Neck: No stridor. Hematological/Lymphatic/Immunilogical: No cervical lymphadenopathy. Cardiovascular: Normal rate, regular rhythm.  No murmurs, rubs, or gallops.  Respiratory: Normal respiratory effort without tachypnea nor retractions. Breath sounds are clear and equal bilaterally. No wheezes/rales/rhonchi. Gastrointestinal: Soft and non tender. No rebound. No guarding.  Genitourinary: Deferred Musculoskeletal: Normal range of motion in all extremities. No lower extremity edema. Neurologic: Somnolent, will awaken to verbal stimuli.  Will mumble. Skin:  Skin is warm, dry and intact. No rash noted.  ____________________________________________    LABS (pertinent positives/negatives)  CMP na 145, k 2.6, cr 1.43, calcium >15 CBC wbc 9.9, hgb 10.3, plt 221 UA hazy, trace leukocytes, 11-20  wbc Lactic 2.8   ____________________________________________    RADIOLOGY  CXR Concern for left lower pneumonia  ____________________________________________   PROCEDURES  Procedures  CRITICAL CARE Performed by: Nance Pear   Total critical care time: 35 minutes  Critical care time was exclusive of separately billable procedures and treating other patients.  Critical care was necessary to treat or prevent imminent or life-threatening deterioration.  Critical care was time spent personally by me on the following activities: development of treatment plan with patient and/or surrogate as well as nursing, discussions with consultants, evaluation of patient's response to treatment, examination of patient, obtaining history from patient or surrogate, ordering and performing treatments and interventions, ordering and review of laboratory studies, ordering and review of radiographic studies, pulse oximetry and re-evaluation of patient's condition.  ____________________________________________   INITIAL IMPRESSION / ASSESSMENT AND PLAN /  ED COURSE  Pertinent labs & imaging results that were available during my care of the patient were reviewed by me and considered in my medical decision making (see chart for details).   Patient presented to the emergency department today because of concerns for altered mental status.  On exam patient is somnolent.  She will mumble to verbal stimuli.  Blood work is notable for significantly elevated calcium.  Do have concerns given setting of multiple myeloma.  Do think this could explain a lot of her altered mental status.  Because of this patient was started on IV fluids, calcitonin and bisphosphonate.  Additionally urine and chest so history is suggestive of possible infection.  Will give patient IV antibiotics.  Discussed both with nephrology and oncology.  Will plan on admission. ____________________________________________   FINAL CLINICAL  IMPRESSION(S) / ED DIAGNOSES  Final diagnoses:  Altered mental status, unspecified altered mental status type  Hypercalcemia     Note: This dictation was prepared with Dragon dictation. Any transcriptional errors that result from this process are unintentional     Nance Pear, MD 03/02/19 1404

## 2019-03-02 NOTE — Progress Notes (Signed)
Advanced Care Plan.  Purpose of Encounter: CODE STATUS. Parties in Attendance: The patient, her daughter, RN and me. Patient's Decisional Capacity: No. Medical Story: Cassie Huffman  is a 85 y.o. female with a known history of A. fib, breast cancer, multiple myeloma, CHF, hypertension, diabetes and umbilical hernia.  The patient is being admitted for severe hypercalcemia, severe hypokalemia and acute renal failure.  I discussed with the patient's daughter about her current condition, poor prognosis and CODE STATUS.  The patient's daughter, who is her POA, does not want her to be resuscitated or intubated if she has cardiopulmonary arrest.  She said put DNR now and she will discuss with her sister. Goals of Care Determinations: Palliative care or hospice care. Plan:  Code Status: DNR. Time spent discussing advance care planning: 18 minutes.

## 2019-03-02 NOTE — ED Notes (Signed)
ED TO INPATIENT HANDOFF REPORT  ED Nurse Name and Phone #: Ignatius Specking [phone]  S Name/Age/Gender Cassie Huffman 85 y.o. female Room/Bed: ED03A/ED03A  Code Status   Code Status: DNR  Home/SNF/Other Home Patient oriented to: self Is this baseline? No   Triage Complete: Triage complete  Chief Complaint AMS  Triage Note Pt comes into the ED via EMS from home, EMS reports pt lives with daughter and states pt has dementia at baseline, but for the past 3 days had a decline in mental status, was dx with a UTI and was given one dose of abx. Pt mucous membranes are dry, some yellow skin color noted on arrival. Pt is not follow commands, is not opening eyes, grunts and grimaces with movement or stimulus. Respirations WNL.low o2 sats 82% , pt placed on 3L [place]   Allergies Allergies  Allergen Reactions  . Tape Hives    Pt states that she gets whelps that spread out from the Bandaid Pt states that she gets whelps that spread out from the Bandaid Pt states that she gets whelps that spread out from the Bandaid Pt states that she gets whelps that spread out from the Bandaid Pt states that she gets whelps that spread out from the Bandaid    Level of Care/Admitting Diagnosis ED Disposition    ED Disposition Condition [place]: [place] [100120]  Level of Care: Telemetry [5]  Covid Evaluation: Confirmed COVID Negative  Diagnosis: Hypercalcemia [275.42.ICD-9-CM]  Admitting Physician: Demetrios Loll [[id number]]  Attending Physician: Demetrios Loll [[id number]]  Estimated length of stay: 3 - 4 days  Certification:: I certify this patient will need inpatient services for at least 2 midnights  PT Class (Do Not Modify): Inpatient [101]  PT Acc Code (Do Not Modify): Private [1]       B Medical/Surgery History Past Medical History:  Diagnosis Date  . A-fib ([place])   . Breast cancer ([place])   . Cancer ([place])   . CHF (congestive heart failure) ([place])   . Diabetes  mellitus without complication ([place])   . Hypertension   . Multiple myeloma ([place])   . Umbilical hernia    Past Surgical History:  Procedure Laterality Date  . bilateral breast surgery  2000     A IV Location/Drains/Wounds Patient Lines/Drains/Airways Status   Active Line/Drains/Airways    Name:   Placement date:   Placement time:   Site:   Days:   Peripheral IV 03/02/19 Right Antecubital   03/02/19    1004    Antecubital   less than 1   Peripheral IV 03/02/19 Left Antecubital   03/02/19    1017    Antecubital   less than 1          Intake/Output Last 24 hours  Intake/Output Summary (Last 24 hours) at 03/02/2019 1347 Last data filed at 03/02/2019 1251 Gross per 24 hour  Intake 2100 ml  Output no documentation  Net 2100 ml    Labs/Imaging Results for orders placed or performed during the hospital encounter of 03/02/19 (from the past 48 hour(s))  CBC     Status: Abnormal   Collection Time: 03/02/19 10:05 AM  Result Value Ref Range   WBC 9.9 4.0 - 10.5 K/uL   RBC 3.21 (L) 3.87 - 5.11 MIL/uL   Hemoglobin 10.3 (L) 12.0 - 15.0 g/dL   HCT 33.9 (L) 36.0 - 46.0 %   MCV 105.6 (H) 80.0 - 100.0 fL   MCH  32.1 26.0 - 34.0 pg   MCHC 30.4 30.0 - 36.0 g/dL   RDW 19.9 (H) 11.5 - 15.5 %   Platelets 221 150 - 400 K/uL   nRBC 0.6 (H) 0.0 - 0.2 %    Comment: Performed at [facility], [address] [place], [place] [address]  Urinalysis, Complete w Microscopic     Status: Abnormal   Collection Time: 03/02/19 10:05 AM  Result Value Ref Range   Color, Urine YELLOW (A) YELLOW   APPearance HAZY (A) CLEAR   Specific Gravity, Urine 1.012 1.005 - 1.030   pH 5.0 5.0 - 8.0   Glucose, UA NEGATIVE NEGATIVE mg/dL   Hgb urine dipstick NEGATIVE NEGATIVE   Bilirubin Urine NEGATIVE NEGATIVE   Ketones, ur NEGATIVE NEGATIVE mg/dL   Protein, ur NEGATIVE NEGATIVE mg/dL   Nitrite NEGATIVE NEGATIVE   Leukocytes,Ua TRACE (A) NEGATIVE   RBC / HPF 0-5 0 - 5 RBC/hpf   WBC, UA 11-20 0 - 5 WBC/hpf    Bacteria, UA NONE SEEN NONE SEEN   Squamous Epithelial / LPF 0-5 0 - 5   Amorphous [place] PRESENT     Comment: Performed at [facility], [address] [place], [place] [address]  Lactic acid, plasma     Status: Abnormal   Collection Time: 03/02/19 10:19 AM  Result Value Ref Range   Lactic Acid, Venous 2.8 (HH) 0.5 - 1.9 mmol/L    Comment: CRITICAL RESULT CALLED TO, READ BACK BY AND VERIFIED WITH STEPHANIE RUDD AT 1043 03/02/2019.PMF Performed at [facility], [address] [place],  [address]   SARS Coronavirus 2 (CEPHEID- Performed in [facility] hospital lab), Hosp Order     Status: None   Collection Time: 03/02/19 10:20 AM   Specimen: Nasopharyngeal Swab  Result Value Ref Range   SARS Coronavirus 2 NEGATIVE NEGATIVE    Comment: (NOTE) If result is NEGATIVE SARS-CoV-2 target nucleic acids are NOT DETECTED. The SARS-CoV-2 RNA is generally detectable in upper and lower  respiratory specimens during the acute phase of infection. The lowest  concentration of SARS-CoV-2 viral copies this assay can detect is 250  copies / mL. A negative result does not preclude SARS-CoV-2 infection  and should not be used as the sole basis for treatment or other  patient management decisions.  A negative result may occur with  improper specimen collection / handling, submission of specimen other  than nasopharyngeal swab, presence of viral mutation(s) within the  areas targeted by this assay, and inadequate number of viral copies  (<250 copies / mL). A negative result must be combined with clinical  observations, patient history, and epidemiological information. If result is POSITIVE SARS-CoV-2 target nucleic acids are DETECTED. The SARS-CoV-2 RNA is generally detectable in upper and lower  respiratory specimens dur ing the acute phase of infection.  Positive  results are indicative of active infection with SARS-CoV-2.  Clinical  correlation with patient history and  other diagnostic information is  necessary to determine patient infection status.  Positive results do  not rule out bacterial infection or co-infection with other viruses. If result is PRESUMPTIVE POSTIVE SARS-CoV-2 nucleic acids MAY BE PRESENT.   A presumptive positive result was obtained on the submitted specimen  and confirmed on repeat testing.  While 2019 novel coronavirus  (SARS-CoV-2) nucleic acids may be present in the submitted sample  additional confirmatory testing may be necessary for epidemiological  and / or clinical management purposes  to differentiate between  SARS-CoV-2 and other Sarbecovirus currently known to infect humans.  If clinically indicated additional testing with an alternate test  methodology [phone]) is advised. The SARS-CoV-2 RNA is generally  detectable in upper and lower respiratory sp ecimens during the acute  phase of infection. The expected result is Negative. Fact Sheet for Patients:  StrictlyIdeas.no Fact Sheet for Healthcare Providers: BankingDealers.co.za This test is not yet approved or cleared by the [place] FDA and has been authorized for detection and/or diagnosis of SARS-CoV-2 by FDA under an Emergency Use Authorization (EUA).  This EUA will remain in effect (meaning this test can be used) for the duration of the COVID-19 declaration under Section 564(b)(1) of the Act, 21 U.S.C. section 360bbb-3(b)(1), unless the authorization is terminated or revoked sooner. Performed at [facility], [address] [place], [place] [address]   Comprehensive metabolic panel     Status: Abnormal   Collection Time: 03/02/19 11:05 AM  Result Value Ref Range   Sodium 145 135 - 145 mmol/L   Potassium 2.6 (LL) 3.5 - 5.1 mmol/L    Comment: CRITICAL RESULT CALLED TO, READ BACK BY AND VERIFIED WITH Gwynn Burly RN AT 1132 ON 03/02/2019 SNG    Chloride 99 98 - 111 mmol/L   CO2 35 (H) 22 - 32 mmol/L    Glucose, Bld 134 (H) 70 - 99 mg/dL   BUN 43 (H) 8 - 23 mg/dL   Creatinine, Ser 1.43 (H) 0.44 - 1.00 mg/dL   Calcium >15.0 (HH) 8.9 - 10.3 mg/dL    Comment: CRITICAL RESULT CALLED TO, READ BACK BY AND VERIFIED WITH Gwynn Burly RN AT 1132 ON 03/02/2019 SNG    Total Protein 7.0 6.5 - 8.1 g/dL   Albumin 3.2 (L) 3.5 - 5.0 g/dL   AST 26 15 - 41 U/L   ALT 13 0 - 44 U/L   Alkaline Phosphatase 69 38 - 126 U/L   Total Bilirubin 1.9 (H) 0.3 - 1.2 mg/dL   GFR calc non Af Amer 32 (L) >60 mL/min   GFR calc Af Amer 37 (L) >60 mL/min   Anion gap 11 5 - 15    Comment: Performed at [facility], [address] [place], [place] [address]  Lactic acid, plasma     Status: Abnormal   Collection Time: 03/02/19  1:01 PM  Result Value Ref Range   Lactic Acid, Venous 3.0 (HH) 0.5 - 1.9 mmol/L    Comment: CRITICAL RESULT CALLED TO, READ BACK BY AND VERIFIED WITH STEPHANIE Zyier Dykema_0 /CHC/03/02/2019 Performed at [facility], [address] [place], [place] [address]   Magnesium     Status: Abnormal   Collection Time: 03/02/19  1:06 PM  Result Value Ref Range   Magnesium 1.5 (L) 1.7 - 2.4 mg/dL    Comment: Performed at [facility], [address] [place], [place] [address]   Dg Chest Portable 1 View  Result Date: 03/02/2019 CLINICAL DATA:  Hypoxia, dementia EXAM: PORTABLE CHEST 1 VIEW COMPARISON:  10/06/2018 FINDINGS: Increased patchy bronchovascular airspace process in the left lower lobe obscures the left hemidiaphragm may represent pneumonia. Stable cardiomegaly and chronic bronchitic change/interstitial prominence. No current CHF. Small left effusion not excluded. Right lung grossly clear. Negative for pneumothorax. Trachea midline. Aorta atherosclerotic and degenerative changes noted of the spine and shoulders. Healed deformity of the right proximal humerus. IMPRESSION: Increased left lower lobe bronchovascular airspace process concerning for pneumonia and likely small left  effusion. Cardiomegaly Electronically Signed   By: Jerilynn Mages.  Shick M.D.   On: 03/02/2019 11:01    Pending Labs Unresulted Labs (From admission, onward)    Start     Ordered   03/03/19 4696  Basic metabolic panel  Tomorrow morning,   STAT     03/02/19 1345   03/03/19 0500  CBC  Tomorrow morning,   STAT     03/02/19 1345   03/03/19 0500  Bilirubin, total  Tomorrow morning,   STAT     03/02/19 1345   03/03/19 0500  Bilirubin, direct  Tomorrow morning,   STAT     03/02/19 1345   03/02/19 1700  Calcium  Once,   STAT     03/02/19 1345   03/02/19 1346  Culture, sputum-assessment  Once,   STAT     03/02/19 1345   03/02/19 1012  Blood Culture (routine x 2)  BLOOD CULTURE X 2,   STAT     03/02/19 1012          Vitals/Pain Today's Vitals   03/02/19 1200 03/02/19 1230 03/02/19 1300 03/02/19 1330  BP: 123/75 (Abnormal) 142/49 137/86 (Abnormal) 156/75  Pulse: (Abnormal) 139 67  87  Resp: 20 (Abnormal) _0 Temp:      TempSrc:      SpO2: 100% 100%  99%  Weight:      Height:        Isolation Precautions Airborne and Contact precautions  Medications Medications  potassium chloride 10 mEq in 100 mL IVPB (10 mEq Intravenous New Bag/Given 03/02/19 1342)  calcitonin (MIACALCIN) injection 282 Units (282 Units Intramuscular Given 03/02/19 1247)  acetaminophen (TYLENOL) tablet 650 mg (has no administration in time range)    Or  acetaminophen (TYLENOL) suppository 650 mg (has no administration in time range)  ondansetron (ZOFRAN) tablet 4 mg (has no administration in time range)    Or  ondansetron (ZOFRAN) injection 4 mg (has no administration in time range)  albuterol (PROVENTIL) (2.5 MG/3ML) 0.083% nebulizer solution 2.5 mg (has no administration in time range)  0.9 %  sodium chloride infusion (has no administration in time range)  HYDROcodone-acetaminophen (NORCO/VICODIN) 5-325 MG per tablet 1-2 tablet (has no administration in time range)  senna-docusate (Senokot-S) tablet 1 tablet (has  no administration in time range)  bisacodyl (DULCOLAX) EC tablet 5 mg (has no administration in time range)  cholestyramine (QUESTRAN) packet 4 g (has no administration in time range)  venlafaxine XR (EFFEXOR-XR) 24 hr capsule 75 mg (has no administration in time range)  dexamethasone (DECADRON) tablet 20 mg (has no administration in time range)  famotidine (PEPCID) tablet 20 mg (has no administration in time range)  loperamide (IMODIUM) capsule 2 mg (has no administration in time range)  ferrous sulfate tablet 325 mg (has no administration in time range)  Rivaroxaban (XARELTO) tablet 15 mg (has no administration in time range)  magnesium chloride (SLOW-MAG) 64 MG SR tablet 64 mg (has no administration in time range)  PreserVision AREDS 2 CAPS 1 capsule (has no administration in time range)  potassium chloride (K-DUR) CR tablet 10 mEq (has no administration in time range)  metoprolol succinate (TOPROL-XL) 24 hr tablet 25 mg (has no administration in time range)  cefTRIAXone (ROCEPHIN) 2 g in sodium chloride 0.9 % 100 mL IVPB (has no administration in time range)  azithromycin (ZITHROMAX) 500 mg in sodium chloride 0.9 % 250 mL IVPB (has no administration in time range)  guaiFENesin-dextromethorphan (ROBITUSSIN DM) 100-10 MG/5ML syrup 5 mL (has no administration in time range)  insulin aspart (novoLOG) injection 0-9 Units (has no administration in time range)  insulin aspart (novoLOG) injection 0-5 Units (has no administration in time range)  sodium chloride flush (NS) 0.9 % injection 3 mL (3 mLs Intravenous Given 03/02/19 1045)  sodium chloride 0.9 % bolus 1,000 mL (0 mLs Intravenous Stopped 03/02/19 1125)  cefTRIAXone (ROCEPHIN) 1 g in sodium chloride 0.9 % 100 mL IVPB (0 g Intravenous Stopped 03/02/19 1205)  azithromycin (ZITHROMAX) 500 mg in sodium chloride 0.9 % 250 mL IVPB (0 mg Intravenous Stopped 03/02/19 1251)  sodium chloride 0.9 % bolus 1,000 mL (0 mLs Intravenous Stopped 03/02/19 1251)   zolendronic acid (ZOMETA) 3 mg in sodium chloride 0.9 % 100 mL IVPB (0 mg Intravenous Stopped 03/02/19 1338)    Mobility walks with device High fall risk   Focused Assessments Cardiac Assessment Handoff:  Cardiac Rhythm: Atrial fibrillation, Bundle branch block Lab Results  Component Value Date   TROPONINI <0.03 11/08/2016   No results found for: DDIMER Does the Patient currently have chest pain? No     R Recommendations: See Admitting Provider Note  Report given to:   Additional Notes: na

## 2019-03-02 NOTE — ED Notes (Signed)
Called pharmacy for medications.

## 2019-03-02 NOTE — Consult Note (Signed)
PHARMACY CONSULT NOTE - FOLLOW UP  Pharmacy Consult for Electrolyte Monitoring and Replacement   Recent Labs: Potassium (mmol/L)  Date Value  03/02/2019 2.6 (LL)  09/13/2014 3.6   Magnesium (mg/dL)  Date Value  03/02/2019 1.5 (L)   Calcium (mg/dL)  Date Value  03/02/2019 >15.0 (HH)   Calcium, Total (mg/dL)  Date Value  09/13/2014 8.7   Albumin (g/dL)  Date Value  03/02/2019 3.2 (L)  09/13/2014 3.5   Sodium (mmol/L)  Date Value  03/02/2019 145  09/13/2014 136     Assessment: Pharmacy has been consulted to monitor and replenish electrolytes in 85yo patient.  7/3: K 2.6, Mag 1.5, Ca>15   Goal of Therapy:  Electrolytes WNL  Plan:  -Patient has been ordered Mag64 PO Daily, which is considered a slow release magnesium tablet and KCl 8mEq PO BID. -Hospitalist has placed order for Mag Sulfate 2g IV x 1 and KCl 38mEq IV x 4 doses.  -Spoke to hospitalist regarding high Calcium level. Prefers to initially treat with fluids(NS@100ml /hr) prior to any pharmacotherapy. Will order subsequent labs as deemed necessary. -Will hold off on further supplementation at this time.  Will check levels with AM labs and supplement as needed.  Pearla Dubonnet ,PharmD Clinical Pharmacist 03/02/2019 3:51 PM

## 2019-03-03 ENCOUNTER — Inpatient Hospital Stay: Payer: Self-pay

## 2019-03-03 ENCOUNTER — Inpatient Hospital Stay
Admission: EM | Disposition: A | Discharge status: Hospice | Payer: Self-pay | Source: Home / Self Care | Attending: Internal Medicine

## 2019-03-03 DIAGNOSIS — Z66 Do not resuscitate: Secondary | ICD-10-CM

## 2019-03-03 DIAGNOSIS — C9000 Multiple myeloma not having achieved remission: Secondary | ICD-10-CM

## 2019-03-03 HISTORY — PX: TEMPORARY DIALYSIS CATHETER: CATH118312

## 2019-03-03 LAB — TSH: TSH: 0.241 u[IU]/mL — ABNORMAL LOW (ref 0.350–4.500)

## 2019-03-03 LAB — BASIC METABOLIC PANEL
Anion gap: 9 (ref 5–15)
BUN: 41 mg/dL — ABNORMAL HIGH (ref 8–23)
CO2: 33 mmol/L — ABNORMAL HIGH (ref 22–32)
Calcium: >15 mg/dL (ref 8.9–10.3)
Chloride: 106 mmol/L (ref 98–111)
Creatinine, Ser: 1.5 mg/dL — ABNORMAL HIGH (ref 0.44–1.00)
GFR calc Af Amer: 35 mL/min — ABNORMAL LOW (ref >60)
GFR calc non Af Amer: 30 mL/min — ABNORMAL LOW (ref >60)
Glucose, Bld: 96 mg/dL (ref 70–99)
Potassium: 2.9 mmol/L — ABNORMAL LOW (ref 3.5–5.1)
Sodium: 148 mmol/L — ABNORMAL HIGH (ref 135–145)

## 2019-03-03 LAB — CBC
HCT: 34.9 % — ABNORMAL LOW (ref 36.0–46.0)
Hemoglobin: 10.3 g/dL — ABNORMAL LOW (ref 12.0–15.0)
MCH: 32.3 pg (ref 26.0–34.0)
MCHC: 29.5 g/dL — ABNORMAL LOW (ref 30.0–36.0)
MCV: 109.4 fL — ABNORMAL HIGH (ref 80.0–100.0)
Platelets: 179 10*3/uL (ref 150–400)
RBC: 3.19 MIL/uL — ABNORMAL LOW (ref 3.87–5.11)
RDW: 20.5 % — ABNORMAL HIGH (ref 11.5–15.5)
WBC: 8.5 10*3/uL (ref 4.0–10.5)
nRBC: 0.6 % — ABNORMAL HIGH (ref 0.0–0.2)

## 2019-03-03 LAB — MAGNESIUM
Magnesium: 1.9 mg/dL (ref 1.7–2.4)
Magnesium: 1.5 mg/dL — ABNORMAL LOW (ref 1.7–2.4)

## 2019-03-03 LAB — RENAL FUNCTION PANEL
Albumin: 2.6 g/dL — ABNORMAL LOW (ref 3.5–5.0)
Anion gap: 7 (ref 5–15)
BUN: 22 mg/dL (ref 8–23)
CO2: 34 mmol/L — ABNORMAL HIGH (ref 22–32)
Calcium: 12.5 mg/dL — ABNORMAL HIGH (ref 8.9–10.3)
Chloride: 102 mmol/L (ref 98–111)
Creatinine, Ser: 0.9 mg/dL (ref 0.44–1.00)
GFR calc Af Amer: >60 mL/min (ref >60)
GFR calc non Af Amer: 56 mL/min — ABNORMAL LOW (ref >60)
Glucose, Bld: 100 mg/dL — ABNORMAL HIGH (ref 70–99)
Phosphorus: 1.9 mg/dL — ABNORMAL LOW (ref 2.5–4.6)
Potassium: 2.9 mmol/L — ABNORMAL LOW (ref 3.5–5.1)
Sodium: 143 mmol/L (ref 135–145)

## 2019-03-03 LAB — GLUCOSE, CAPILLARY
Glucose-Capillary: 86 mg/dL (ref 70–99)
Glucose-Capillary: 79 mg/dL (ref 70–99)
Glucose-Capillary: 49 mg/dL — ABNORMAL LOW (ref 70–99)
Glucose-Capillary: 103 mg/dL — ABNORMAL HIGH (ref 70–99)
Glucose-Capillary: 119 mg/dL — ABNORMAL HIGH (ref 70–99)
Glucose-Capillary: 88 mg/dL (ref 70–99)
Glucose-Capillary: 94 mg/dL (ref 70–99)

## 2019-03-03 LAB — LACTIC ACID, PLASMA: Lactic Acid, Venous: 2.7 mmol/L (ref 0.5–1.9)

## 2019-03-03 LAB — BILIRUBIN, DIRECT: Bilirubin, Direct: 0.5 mg/dL — ABNORMAL HIGH (ref 0.0–0.2)

## 2019-03-03 LAB — PHOSPHORUS
Phosphorus: 4.2 mg/dL (ref 2.5–4.6)
Phosphorus: 1.9 mg/dL — ABNORMAL LOW (ref 2.5–4.6)

## 2019-03-03 LAB — BILIRUBIN, TOTAL: Total Bilirubin: 1.3 mg/dL — ABNORMAL HIGH (ref 0.3–1.2)

## 2019-03-03 SURGERY — TEMPORARY DIALYSIS CATHETER
Anesthesia: LOCAL

## 2019-03-03 MED ORDER — CHLORHEXIDINE GLUCONATE 0.12 % MT SOLN
15.0000 mL | Freq: Two times a day (BID) | OROMUCOSAL | Status: DC
  Administered 2019-03-03 – 2019-03-06 (×6): 15 mL via OROMUCOSAL
  Filled 2019-03-03 (×5): qty 15

## 2019-03-03 MED ORDER — DEXTROSE 50 % IV SOLN
25.0000 g | INTRAVENOUS | Status: AC
  Administered 2019-03-03: 25 g via INTRAVENOUS

## 2019-03-03 MED ORDER — SODIUM CHLORIDE 0.9 % IV SOLN
90.0000 mg | Freq: Once | INTRAVENOUS | Status: DC
  Filled 2019-03-03: qty 30
  Filled 2019-03-03: qty 10

## 2019-03-03 MED ORDER — SODIUM CHLORIDE 0.9 % IV SOLN: 100.0000 mL | INTRAVENOUS | Status: DC | PRN

## 2019-03-03 MED ORDER — MAGNESIUM SULFATE 2 GM/50ML IV SOLN
2.0000 g | Freq: Once | INTRAVENOUS | Status: AC
  Administered 2019-03-03: 2 g via INTRAVENOUS
  Filled 2019-03-03: qty 50

## 2019-03-03 MED ORDER — PENTAFLUOROPROP-TETRAFLUOROETH EX AERO
1.0000 "application " | INHALATION_SPRAY | CUTANEOUS | Status: DC | PRN
  Filled 2019-03-03: qty 30

## 2019-03-03 MED ORDER — LIDOCAINE-PRILOCAINE 2.5-2.5 % EX CREA
1.0000 "application " | TOPICAL_CREAM | CUTANEOUS | Status: DC | PRN
  Filled 2019-03-03: qty 5

## 2019-03-03 MED ORDER — ALTEPLASE 2 MG IJ SOLR: 2.0000 mg | Freq: Once | INTRAMUSCULAR | Status: DC | PRN

## 2019-03-03 MED ORDER — POTASSIUM CHLORIDE 10 MEQ/100ML IV SOLN
10.0000 meq | INTRAVENOUS | Status: DC
  Filled 2019-03-03 (×6): qty 100

## 2019-03-03 MED ORDER — DEXTROSE-NACL 5-0.45 % IV SOLN
INTRAVENOUS | Status: DC
  Administered 2019-03-03: 23:00:00 via INTRAVENOUS

## 2019-03-03 MED ORDER — SODIUM CHLORIDE 0.9 % IV SOLN
INTRAVENOUS | Status: DC | PRN
  Administered 2019-03-03 – 2019-03-05 (×3): 500 mL via INTRAVENOUS

## 2019-03-03 MED ORDER — CHLORHEXIDINE GLUCONATE CLOTH 2 % EX PADS
6.0000 | MEDICATED_PAD | Freq: Every day | CUTANEOUS | Status: DC
  Administered 2019-03-04 – 2019-03-06 (×2): 6 via TOPICAL

## 2019-03-03 MED ORDER — SODIUM CHLORIDE 0.45 % IV SOLN
INTRAVENOUS | Status: DC
  Administered 2019-03-03 (×2): via INTRAVENOUS

## 2019-03-03 MED ORDER — HEPARIN SODIUM (PORCINE) 1000 UNIT/ML DIALYSIS
1000.0000 [IU] | INTRAMUSCULAR | Status: DC | PRN
  Filled 2019-03-03: qty 1

## 2019-03-03 MED ORDER — DEXTROSE 50 % IV SOLN
INTRAVENOUS | Status: AC
  Administered 2019-03-03: 50 mL
  Filled 2019-03-03: qty 50

## 2019-03-03 MED ORDER — LIDOCAINE HCL (PF) 1 % IJ SOLN
5.0000 mL | INTRAMUSCULAR | Status: DC | PRN
  Filled 2019-03-03: qty 5

## 2019-03-03 MED ORDER — SODIUM CHLORIDE 0.9 % IV SOLN
INTRAVENOUS | Status: DC | PRN
  Administered 2019-03-03: 50 mL via INTRAVENOUS

## 2019-03-03 MED ORDER — POTASSIUM CHLORIDE 10 MEQ/100ML IV SOLN
10.0000 meq | INTRAVENOUS | Status: AC
  Administered 2019-03-03 (×5): 10 meq via INTRAVENOUS
  Filled 2019-03-03 (×5): qty 100

## 2019-03-03 MED ORDER — POTASSIUM CHLORIDE 10 MEQ/100ML IV SOLN
10.0000 meq | INTRAVENOUS | Status: AC
  Administered 2019-03-03 – 2019-03-04 (×6): 10 meq via INTRAVENOUS
  Filled 2019-03-03 (×6): qty 100

## 2019-03-03 MED ORDER — ORAL CARE MOUTH RINSE
15.0000 mL | Freq: Two times a day (BID) | OROMUCOSAL | Status: DC
  Administered 2019-03-03 – 2019-03-06 (×4): 15 mL via OROMUCOSAL

## 2019-03-03 SURGICAL SUPPLY — 3 items
COVER PROBE U/S 5X48 (MISCELLANEOUS) ×2 IMPLANT
KIT DIALYSIS CATH TRI 30X13 (CATHETERS) ×2 IMPLANT
WIRE BENTSON .035X145CM (WIRE) ×2 IMPLANT

## 2019-03-03 NOTE — Consult Note (Signed)
[facility]  Patient name: Cassie Huffman MRN: [id number] DOB: March 03, 1927 Sex: female     REASON FOR CONSULT:    dialysis  HISTORY OF PRESENT ILLNESS:   Cassie Huffman is a 85 y.o. 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 A-fib ([place])   . Breast cancer ([place])   . Cancer ([place])   . CHF (congestive heart failure) ([place])   . Diabetes mellitus without complication ([place])   . Hypertension   . Multiple myeloma ([place])   . Umbilical hernia      FAMILY HISTORY   Family History  Problem Relation Age of Onset  . Stroke Father   . Breast cancer Sister     SOCIAL HISTORY:   Social History   Socioeconomic History  . Marital status: Single    Spouse name: Not on file  . Number of children: Not on file  . Years of education: Not on file  . Highest education level: Not on file  Occupational History  . Not on file  Social Needs  . Financial resource strain: Not on file  . Food insecurity    Worry: Not on file    Inability: Not on file  . Transportation needs    Medical: Not on file    Non-medical: Not on file  Tobacco Use  . Smoking status: Never Smoker  . Smokeless tobacco: Never Used  Substance and Sexual Activity  . Alcohol use: No  . Drug use: Never  . Sexual activity: Not on file  Lifestyle  . Physical activity    Days per week: Not on file    Minutes per session: Not on file  . Stress: Not on file  Relationships  . Social Herbalist on phone: Not on file    Gets together: Not on file    Attends religious service: Not on file    Active member of club or organization: Not on file    Attends meetings of clubs or organizations: Not on file    Relationship status: Not on file  . Intimate partner violence    Fear of current or ex partner: Not on file    Emotionally abused: Not on file    Physically  abused: Not on file    Forced sexual activity: Not on file  Other Topics Concern  . Not on file  Social History Narrative  . Not on file    ALLERGIES:    Allergies  Allergen Reactions  . Tape Hives    Pt states that she gets whelps that spread out from the Bandaid Pt states that she gets whelps that spread out from the Bandaid Pt states that she gets whelps that spread out from the Bandaid Pt states that she gets whelps that spread out from the Bandaid Pt states that she gets whelps that spread out from the Bandaid    CURRENT MEDICATIONS:    Current Facility-Administered Medications  Medication Dose Route Frequency Provider Last Rate Last Dose  . 0.45 % sodium chloride infusion   Intravenous Continuous Demetrios Loll, MD 125 mL/hr at 03/03/19 0850    . 0.9 %  sodium chloride infusion   Intravenous PRN Demetrios Loll, MD 10 mL/hr at 03/03/19 0749 50 mL at 03/03/19 0749  . 0.9 %  sodium chloride infusion   Intravenous PRN Demetrios Loll, MD   Stopped at  03/03/19 1246  . acetaminophen (TYLENOL) tablet 650 mg  650 mg Oral Q6H PRN Demetrios Loll, MD       Or  . acetaminophen (TYLENOL) suppository 650 mg  650 mg Rectal Q6H PRN Demetrios Loll, MD      . albuterol (PROVENTIL) (2.5 MG/3ML) 0.083% nebulizer solution 2.5 mg  2.5 mg Nebulization Q2H PRN Demetrios Loll, MD      . azithromycin [facility]) 500 mg in sodium chloride 0.9 % 250 mL IVPB  500 mg Intravenous Q24H Demetrios Loll, MD   Stopped at 03/03/19 1314  . bisacodyl (DULCOLAX) EC tablet 5 mg  5 mg Oral Daily PRN Demetrios Loll, MD      . calcitonin (MIACALCIN) injection 282 Units  4 Units/kg Intramuscular BID Nance Pear, MD   282 Units at 03/03/19 1303  . cefTRIAXone (ROCEPHIN) 2 g in sodium chloride 0.9 % 100 mL IVPB  2 g Intravenous Q24H Demetrios Loll, MD   Stopped at 03/03/19 1246  . chlorhexidine (PERIDEX) 0.12 % solution 15 mL  15 mL Mouth Rinse BID Demetrios Loll, MD   15 mL at 03/03/19 1030  . cholestyramine (QUESTRAN) packet 4 g  4 g Oral Daily Demetrios Loll, MD      . Derrill Memo ON 03/04/2019] dexamethasone (DECADRON) tablet 20 mg  20 mg Oral Weekly Demetrios Loll, MD      . famotidine (PEPCID) tablet 20 mg  20 mg Oral Daily Demetrios Loll, MD      . ferrous sulfate tablet 325 mg  325 mg Oral BID WC Demetrios Loll, MD      . guaiFENesin-dextromethorphan [facility] DM) 100-10 MG/5ML syrup 5 mL  5 mL Oral Q4H PRN Demetrios Loll, MD      . HYDROcodone-acetaminophen (NORCO/VICODIN) 5-325 MG per tablet 1 tablet  1 tablet Oral Q4H PRN Demetrios Loll, MD      . insulin aspart (novoLOG) injection 0-5 Units  0-5 Units Subcutaneous QHS Demetrios Loll, MD      . insulin aspart (novoLOG) injection 0-9 Units  0-9 Units Subcutaneous TID WC Demetrios Loll, MD      . loperamide (IMODIUM) capsule 4 mg  4 mg Oral Once Demetrios Loll, MD      . magnesium chloride (SLOW-MAG) 64 MG SR tablet 64 mg  1 tablet Oral Daily Demetrios Loll, MD      . MEDLINE mouth rinse  15 mL Mouth Rinse q12n4p Demetrios Loll, MD   15 mL at 03/03/19 1307  . metoprolol succinate (TOPROL-XL) 24 hr tablet 25 mg  25 mg Oral QHS Demetrios Loll, MD      . multivitamin-lutein [facility]) capsule 1 capsule  1 capsule Oral BID Demetrios Loll, MD      . ondansetron [facility]) tablet 4 mg  4 mg Oral Q6H PRN Demetrios Loll, MD       Or  . ondansetron [facility]) injection 4 mg  4 mg Intravenous Q6H PRN Demetrios Loll, MD      . potassium chloride (K-DUR) CR tablet 10 mEq  10 mEq Oral BID Demetrios Loll, MD      . potassium chloride 10 mEq in 100 mL IVPB  10 mEq Intravenous Q1 Hr x 5 Mansy, Jan A, MD 100 mL/hr at 03/03/19 1316 10 mEq at 03/03/19 1316  . Rivaroxaban (XARELTO) tablet 15 mg  15 mg Oral Daily Demetrios Loll, MD      . senna-docusate (Senokot-S) tablet 1 tablet  1 tablet Oral QHS PRN Demetrios Loll, MD      .  venlafaxine XR (EFFEXOR-XR) 24 hr capsule 75 mg  75 mg Oral Daily Demetrios Loll, MD        REVIEW OF SYSTEMS:   _0  denotes positive finding, _1  denotes negative finding Cardiac  Comments:  Chest pain or chest pressure:    Shortness of breath upon  exertion:    Short of breath when lying flat:    Irregular heart rhythm:        Vascular    Pain in calf, thigh, or hip brought on by ambulation:    Pain in feet at night that wakes you up from your sleep:     Blood clot in your veins:    Leg swelling:         Pulmonary    Oxygen at home:    Productive cough:     Wheezing:         Neurologic    Sudden weakness in arms or legs:     Sudden numbness in arms or legs:     Sudden onset of difficulty speaking or slurred speech:    Temporary loss of vision in one eye:     Problems with dizziness:         Gastrointestinal    Blood in stool:      Vomited blood:         Genitourinary    Burning when urinating:     Blood in urine:        Psychiatric    Major depression:         Hematologic    Bleeding problems:    Problems with blood clotting too easily:        Skin    Rashes or ulcers:        Constitutional    Fever or chills:     PHYSICAL EXAM:   Vitals:   03/02/19 1949 03/03/19 0218 03/03/19 0500 03/03/19 0743  BP: (!) 92/53  128/72 133/70  Pulse: 67  (!) 55 80  Resp: _2 Temp: (!) 97.5 F (36.4 C)  98.1 F (36.7 C) 98.9 F (37.2 C)  TempSrc: Oral  Oral Oral  SpO2: 100%  94% 92%  Weight:  63.8 kg    Height:        GENERAL: Patient is not very responsive CARDIAC: There is a regular rate and rhythm.  PULMONARY: Nonlabored respirations ABDOMEN: Soft and non-tender  MUSCULOSKELETAL: There are no major deformities or cyanosis. NEUROLOGIC: No focal weakness or paresthesias are detected. SKIN: There are no ulcers or rashes noted. PSYCHIATRIC: The patient has a normal affect.  STUDIES:   none  ASSESSMENT and PLAN   I had a discussion with her daughter Henrietta Dine) about the procedure.  She has spoken with her siblings and they want to try dialysis to see if this helps her improve.  I discussed the procedure with her and answered all of her questions.  Leia Alf, MD, FACS [facility]  [facility] [phone] Pager [phone]

## 2019-03-03 NOTE — Progress Notes (Signed)
PHARMACY CONSULT NOTE - FOLLOW UP  Pharmacy Consult for Electrolyte Monitoring and Replacement   Recent Labs: Potassium (mmol/L)  Date Value  03/03/2019 2.9 (L)  09/13/2014 3.6   Magnesium (mg/dL)  Date Value  03/03/2019 1.5 (L)   Calcium (mg/dL)  Date Value  03/03/2019 12.5 (H)   Calcium, Total (mg/dL)  Date Value  09/13/2014 8.7   Albumin (g/dL)  Date Value  03/03/2019 2.6 (L)  09/13/2014 3.5   Phosphorus (mg/dL)  Date Value  03/03/2019 1.9 (L)   Sodium (mmol/L)  Date Value  03/03/2019 143  09/13/2014 136     Assessment: 7/4 :  K @ 1629 = 2.9   Goal of Therapy:  K : 3.5 - 5.1   Plan:  Will order additional KCl 10 mEq IV X 6 and recheck electrolytes on 7/5 with AM labs.   Orene Desanctis ,PharmD Clinical Pharmacist 03/03/2019 6:30 PM

## 2019-03-03 NOTE — Plan of Care (Signed)
  Problem: Activity: Goal: Risk for activity intolerance will decrease Outcome: Not Progressing   Problem: Nutrition: Goal: Adequate nutrition will be maintained Outcome: Not Progressing   Problem: Safety: Goal: Ability to remain free from injury will improve Outcome: Progressing

## 2019-03-03 NOTE — Progress Notes (Signed)
Post HD Assessment    03/03/19 1756  Neurological  Level of Consciousness Responds to Pain  Respiratory  Respiratory Pattern Irregular  Cardiac  ECG Monitor Yes  Cardiac Rhythm Atrial fibrillation  Vascular  R Radial Pulse +3  L Radial Pulse +3  Edema Right lower extremity;Left lower extremity  RLE Edema +2  LLE Edema +1  Integumentary  Integumentary (WDL) X  Musculoskeletal  Musculoskeletal (WDL) X  Generalized Weakness Yes  GU Assessment  Genitourinary (WDL) X  Genitourinary Symptoms  (HD tx today for HyperCa2+)  Urine Characteristics  Urine Color Yellow/straw

## 2019-03-03 NOTE — Progress Notes (Addendum)
Hypoglycemic Event  CBG:49  Treatment: D50% 25 mg ampule  Symptoms: : UTA pt was asleep  Follow-up CBG: Time 2255 CBG Result:119 Possible Reasons for Event: Unable to take PO due to lethargy and sleepiness  Comments/MD notified Gardiner Barefoot NP and instructed to check blood sugar  Q 15 mins x 2 then check 30 mins x 2 intruct to call if blood sugar < 100. Will continue to monitor  2215: Blood sugar 103  2238: Pt Blood sugar at 94. Notify Levada Dy  And order to change 0.45 normal saline rate of 125 ml/hr change it to D5 0.45 % at a rate of 125 ml/hr. Check blood sugar Q 1 Hr and ordered by Levada Dy NP to call her at 2AM. Will continue to monitor.  Update 1106 Blood sugar at 88.   Update 0019: Pt blood sugar at 124.  Update 0105: 114 glucometer not sinking yet.   Update 0225: Blood sugar at 125 notify [facility] and talked to Dr. Sidney Ace ordered to d/c insulin and D5 half normal saline and reordered the 0.45 normal saline at the same rate 125 ml/hr.      Zoii Florer Elmon Kirschner

## 2019-03-03 NOTE — Progress Notes (Signed)
HD Tx End  Pt remains only responsive to pain. BP reading elevated, potentially due to tremor, as it reads in 120/80s when BP reads between episodes of tremor/jerking.      03/03/19 1745  Hand-Off documentation  Report given to (Full Name) Bethel Born 2A RN  Report received from (Full Name) Trellis Paganini RN  Vital Signs  Pulse Rate 74  Resp (!) 24  BP (!) 189/98  BP Location Left Arm (multiple BP checks multiple sites,increase in tremor)  Oxygen Therapy  SpO2 99 %  O2 Device Nasal Cannula  O2 Flow Rate (L/min) 3 L/min  Pain Assessment  Pain Scale CPOT  Critical Care Pain Observation Tool (CPOT)  Facial Expression 1  Body Movements 0  Muscle Tension 1  Compliance with ventilator (intubated pts.) N/A  Vocalization (extubated pts.) 1  CPOT Total 3  Dialysis Weight  Weight 63.8 kg  Type of Weight Post-Dialysis  During Hemodialysis Assessment  Blood Flow Rate (mL/min) 200 mL/min  Arterial Pressure (mmHg) -100 mmHg  Venous Pressure (mmHg) 50 mmHg  Transmembrane Pressure (mmHg) 60 mmHg  Ultrafiltration Rate (mL/min) 0 mL/min  Dialysate Flow Rate (mL/min) 500 ml/min  Conductivity: Machine  14  HD Safety Checks Performed Yes  Dialysis Fluid Bolus Normal Saline  Bolus Amount (mL) 250 mL  Intra-Hemodialysis Comments Tx completed  Post-Hemodialysis Assessment  Rinseback Volume (mL) 250 mL  Dialyzer Clearance Lightly streaked  Duration of HD Treatment -hour(s) 2.5 hour(s)  Hemodialysis Intake (mL) 500 mL  UF Total -Machine (mL) 500 mL  Net UF (mL) 0 mL  Hemodialysis Catheter Right Femoral vein Triple lumen Temporary (Non-Tunneled)  Placement Date/Time: 03/03/19 1357   Time Out: Correct patient;Correct site;Correct procedure  Maximum sterile barrier precautions: Hand hygiene;Cap;Sterile gown;Mask;Large sterile sheet;Sterile gloves  Site Prep: Chlorhexidine (preferred);Skin Prep C...  Site Condition No complications  Blue Lumen Status Flushed;Capped (Central line);Heparin locked   Red Lumen Status Flushed;Capped (Central line);Heparin locked  Catheter fill solution Heparin 1000 units/ml  Dressing Type Gauze/Drain sponge  Dressing Status Clean;Dry;Intact  Drainage Description None  Dressing Change Due 02/25/19  Post treatment catheter status Capped and Clamped

## 2019-03-03 NOTE — Progress Notes (Signed)
Mouth and tongue are very dry.  Mouth breathing.  High risk oral care ordered.

## 2019-03-03 NOTE — Progress Notes (Signed)
Pre HD Assessment    03/03/19 1520  Neurological  Level of Consciousness Responds to Pain  Respiratory  Respiratory Pattern Regular  Cardiac  ECG Monitor Yes  Cardiac Rhythm Atrial fibrillation  Vascular  R Radial Pulse +3  L Radial Pulse +3  Edema Right lower extremity;Left lower extremity  RLE Edema +2  LLE Edema +1  Integumentary  Integumentary (WDL) X  Additional Integumentary Comments  (HD first tx, catheter exit site)  Musculoskeletal  Musculoskeletal (WDL) X  Generalized Weakness Yes  GU Assessment  Genitourinary (WDL) X  Genitourinary Symptoms  (HD tx today for HyperCa2+)  Urine Characteristics  Urine Color Yellow/straw

## 2019-03-03 NOTE — Consult Note (Signed)
[facility]  Date of admission:  03/03/2019  Inpatient day:  03/03/2019  Consulting physician: Dr Demetrios Loll  Reason for Consultation:  Multiple myeloma.  Hypercalcemia.  Chief Complaint: Cassie Huffman is a 85 y.o. female with multiple myeloma who was admitted through the emergency room with altered mental status and hypercalcemia.  HPI:  The patient was diagnosed with IgG lambda light chain multiple myeloma on 01/26/2019.  Bone marrow revealed 50% plasma cells.  M-spike was 2.1 gm/dL.  Free light chain ratio was 0.14.  Albumen was 3.6, calcium 9.6, and creatinine 1.17  on 02/23/2019.  Bone survey on 01/20/2019 revealed advanced osteopenia.  There was evidence of multiple lucent lesions suggesting metastaticdisease/multiple myeloma, predominantly involving the skull, with additional questionable focal lucencies of posterior elements of C4, thoracic vertebral bodies (T6, T7, T10, T11, T12), bilateral femurs, and left tibia.  The patient has been seen by Dr Tasia Catchings.  She was initially started on Decadron.  Revlimid 5 mg day 1-21 was started.  The patient presented to the [facility] ER on 03/02/2019 with altered mental status.  Patient cannot provide history.  Notes indicate that the daughter said that she was slightly more altered over the past couple of days.  She has not been taking any calcium or vitamin A per her daughter.  Labs revealed a calcium of > 15, albumen 3.2, protein 7.0, bilirubin 1.9, creatinine 1.43, sodium, 145, and potassium 2.6.  She received IVF, Zometa 3 mg IV, and calcitonin.  Because of concern for possible infection, she was cultured and given IV antibiotics.   Past Medical History:  Diagnosis Date  . A-fib ([place])   . Breast cancer ([place])   . Cancer ([place])   . CHF (congestive heart failure) ([place])   . Diabetes mellitus without complication ([place])   . Hypertension   . Multiple myeloma ([place])   . Umbilical hernia     Past Surgical History:  Procedure  Laterality Date  . bilateral breast surgery  2000    Family History  Problem Relation Age of Onset  . Stroke Father   . Breast cancer Sister     Social History:  reports that she has never smoked. She has never used smokeless tobacco. She reports that she does not drink alcohol or use drugs.  She lives in [place].  She is initially alone.  Later she is accompanied by her daughter, Juliann Pulse.  Allergies:  Allergies  Allergen Reactions  . Tape Hives    Pt states that she gets whelps that spread out from the Bandaid Pt states that she gets whelps that spread out from the Bandaid Pt states that she gets whelps that spread out from the Bandaid Pt states that she gets whelps that spread out from the Bandaid Pt states that she gets whelps that spread out from the Bandaid    Medications Prior to Admission  Medication Sig Dispense Refill  . dexamethasone (DECADRON) 4 MG tablet Take 5 tablets (20 mg total) by mouth once a week. (Patient taking differently: Take 20 mg by mouth every Sunday. ) 100 tablet 0  . diclofenac sodium (VOLTAREN) 1 % GEL Apply 1 application topically as needed.    . ferrous sulfate 325 (65 FE) MG tablet Take 325 mg by mouth 2 (two) times daily with a meal.     . lenalidomide (REVLIMID) 5 MG capsule Take 1 capsule (5 mg total) by mouth daily. Take for 21 days, then hold for 7 days. Repeat every 28 days.  21 capsule 0  . loperamide (IMODIUM) 2 MG capsule Take 1 capsule (2 mg total) by mouth See admin instructions. With onset of loose stool, take 83m followed by 259mevery 2 hours until 12 hours have passed without loose bowel movement. Maximum: 16 mg/day 120 capsule 1  . magnesium chloride (SLOW-MAG) 64 MG TBEC SR tablet Take 1 tablet (64 mg total) by mouth daily. 30 tablet 3  . meclizine (ANTIVERT) 25 MG tablet Take 12.5-25 mg by mouth every 4 (four) hours as needed for dizziness or nausea.     . metFORMIN (GLUCOPHAGE) 500 MG tablet Take 500 mg by mouth daily.    . metolazone  (ZAROXOLYN) 2.5 MG tablet Take 2.5 mg by mouth daily as needed (feet swelling).     . metoprolol succinate (TOPROL-XL) 25 MG 24 hr tablet Take 25 mg by mouth at bedtime.     . Multiple Vitamins-Minerals (PRESERVISION AREDS 2) CAPS Take 1 capsule by mouth 2 (two) times daily.    . nitrofurantoin, macrocrystal-monohydrate, (MACROBID) 100 MG capsule Take 100 mg by mouth 2 (two) times a day.    . ondansetron (ZOFRAN) 8 MG tablet Take 1 tablet (8 mg total) by mouth every 8 (eight) hours as needed for nausea, vomiting or refractory nausea / vomiting. 90 tablet 2  . potassium chloride (K-DUR) 10 MEQ tablet Take 10 mEq by mouth 2 (two) times daily.    . Marland Kitchenorsemide (DEMADEX) 10 MG tablet Take 10 mg by mouth daily.    . traMADol (ULTRAM) 50 MG tablet Take 50-100 mg by mouth every 6 (six) hours as needed for pain.    . Marland Kitchenenlafaxine XR (EFFEXOR-XR) 37.5 MG 24 hr capsule Take 75 mg by mouth daily.     . Alveda Reasons0 MG TABS tablet Take 10 mg by mouth daily.     . famotidine (PEPCID) 20 MG tablet Take 1 tablet (20 mg total) by mouth daily. (Patient not taking: Reported on 03/02/2019) 30 tablet 1    Review of Systems: Unable to be obtained from the patient.  Information provided by her daughter. GENERAL:  Fatigue.  No fevers, sweats. PERFORMANCE STATUS (ECOG):  2 HEENT:  No apparent visual changes, runny nose, sore throat, mouth sores or tenderness. Lungs:  No apparent shortness of breath or cough.  No hemoptysis. Cardiac:  No apparent chest pain, palpitations, orthopnea, or PND. GI:  Abdominal discomfort/constipation.  No nausea, vomiting, diarrhea, melena or hematochezia. GU:  No urgency, frequency, dysuria, or hematuria. Musculoskeletal:  No back pain.  No joint pain.  No muscle tenderness. Extremities:  No pain or swelling. Skin:  No rashes or skin changes. Neuro:  Generalized weakness.  No headache, numbness or weakness, balance or coordination issues. Endocrine:  No diabetes, thyroid issues, hot flashes  or night sweats. Psych:  No mood changes, depression or anxiety. Pain:  No focal pain. Review of systems:  All other systems reviewed and found to be negative.  Physical Exam:  Blood pressure 133/70, pulse 80, temperature 98.9 F (37.2 C), temperature source Oral, resp. rate 20, height 4' 11"  (1.499 m), weight 140 lb 11.2 oz (63.8 kg), SpO2 92 %.  GENERAL:  Elderly woman lying in bed on the medical unit. MENTAL STATUS:  She is not alert or interactive.  She groans and grimaces to pain. HEAD:  GrPearline Cablesair.  Normocephalic, atraumatic, face symmetric, no Cushingoid features. EYES:  Pupils equal round and reactive to light and accomodation.  No conjunctivitis or scleral icterus.   ENT:  Oropharynx clear without lesion.  Tongue normal.  Mucous membranes dry.  RESPIRATORY:  Clear to auscultation anteriorly without rales, wheezes or rhonchi. CARDIOVASCULAR:  Regular rate and rhythm without murmur, rub or gallop. ABDOMEN:  Soft, slightly tender on palpation.  No guarding or rebound tenderness.  Soft bowel sounds.  No hepatosplenomegaly.  No masses. SKIN:  No rashes, ulcers or lesions. EXTREMITIES: No edema, no skin discoloration or tenderness.  No palpable cords. LYMPH NODES: No palpable cervical, supraclavicular, axillary or inguinal adenopathy  NEUROLOGICAL: Grimaces to sternal rub.  Withdraws to pain.  Moves all 4 extremities.   Results for orders placed or performed during the hospital encounter of 03/02/19 (from the past 48 hour(s))  CBC     Status: Abnormal   Collection Time: 03/02/19 10:05 AM  Result Value Ref Range   WBC 9.9 4.0 - 10.5 K/uL   RBC 3.21 (L) 3.87 - 5.11 MIL/uL   Hemoglobin 10.3 (L) 12.0 - 15.0 g/dL   HCT 33.9 (L) 36.0 - 46.0 %   MCV 105.6 (H) 80.0 - 100.0 fL   MCH 32.1 26.0 - 34.0 pg   MCHC 30.4 30.0 - 36.0 g/dL   RDW 19.9 (H) 11.5 - 15.5 %   Platelets 221 150 - 400 K/uL   nRBC 0.6 (H) 0.0 - 0.2 %    Comment: Performed at [facility]  [place], [place] [address]  Urinalysis, Complete w Microscopic     Status: Abnormal   Collection Time: 03/02/19 10:05 AM  Result Value Ref Range   Color, Urine YELLOW (A) YELLOW   APPearance HAZY (A) CLEAR   Specific Gravity, Urine 1.012 1.005 - 1.030   pH 5.0 5.0 - 8.0   Glucose, UA NEGATIVE NEGATIVE mg/dL   Hgb urine dipstick NEGATIVE NEGATIVE   Bilirubin Urine NEGATIVE NEGATIVE   Ketones, ur NEGATIVE NEGATIVE mg/dL   Protein, ur NEGATIVE NEGATIVE mg/dL   Nitrite NEGATIVE NEGATIVE   Leukocytes,Ua TRACE (A) NEGATIVE   RBC / HPF 0-5 0 - 5 RBC/hpf   WBC, UA 11-20 0 - 5 WBC/hpf   Bacteria, UA NONE SEEN NONE SEEN   Squamous Epithelial / LPF 0-5 0 - 5   Amorphous Crystal PRESENT     Comment: Performed at [facility], [address] [place], [place] [address]  Blood Culture (routine x 2)     Status: None (Preliminary result)   Collection Time: 03/02/19 10:06 AM   Specimen: BLOOD  Result Value Ref Range   Specimen Description BLOOD BLOOD RIGHT WRIST    Special Requests      BOTTLES DRAWN AEROBIC AND ANAEROBIC Blood Culture results may not be optimal due to an inadequate volume of blood received in culture bottles   Culture      NO GROWTH < 24 HOURS Performed at [facility], [address] [place], [place] [address]    Report Status PENDING   Lactic acid, plasma     Status: Abnormal   Collection Time: 03/02/19 10:19 AM  Result Value Ref Range   Lactic Acid, Venous 2.8 (HH) 0.5 - 1.9 mmol/L    Comment: CRITICAL RESULT CALLED TO, READ BACK BY AND VERIFIED WITH STEPHANIE RUDD AT 1043 03/02/2019.PMF Performed at [facility], [address] [place], [place] [address]   Blood Culture (routine x 2)     Status: None (Preliminary result)   Collection Time: 03/02/19 10:19 AM   Specimen: BLOOD  Result Value Ref Range   Specimen  Description BLOOD LEFT ANTECUBITAL    Special Requests      BOTTLES DRAWN AEROBIC AND ANAEROBIC Blood Culture results may not be  optimal due to an inadequate volume of blood received in culture bottles   Culture      NO GROWTH < 24 HOURS Performed at [facility], [address], [place], [place] [address]    Report Status PENDING   SARS Coronavirus 2 (CEPHEID- Performed in [place] hospital lab), Hosp Order     Status: None   Collection Time: 03/02/19 10:20 AM   Specimen: Nasopharyngeal Swab  Result Value Ref Range   SARS Coronavirus 2 NEGATIVE NEGATIVE    Comment: (NOTE) If result is NEGATIVE SARS-CoV-2 target nucleic acids are NOT DETECTED. The SARS-CoV-2 RNA is generally detectable in upper and lower  respiratory specimens during the acute phase of infection. The lowest  concentration of SARS-CoV-2 viral copies this assay can detect is 250  copies / mL. A negative result does not preclude SARS-CoV-2 infection  and should not be used as the sole basis for treatment or other  patient management decisions.  A negative result may occur with  improper specimen collection / handling, submission of specimen other  than nasopharyngeal swab, presence of viral mutation(s) within the  areas targeted by this assay, and inadequate number of viral copies  (<250 copies / mL). A negative result must be combined with clinical  observations, patient history, and epidemiological information. If result is POSITIVE SARS-CoV-2 target nucleic acids are DETECTED. The SARS-CoV-2 RNA is generally detectable in upper and lower  respiratory specimens dur ing the acute phase of infection.  Positive  results are indicative of active infection with SARS-CoV-2.  Clinical  correlation with patient history and other diagnostic information is  necessary to determine patient infection status.  Positive results do  not rule out bacterial infection or co-infection with other viruses. If result is PRESUMPTIVE POSTIVE SARS-CoV-2 nucleic acids MAY BE PRESENT.   A presumptive positive result was obtained on the submitted specimen   and confirmed on repeat testing.  While 2019 novel coronavirus  (SARS-CoV-2) nucleic acids may be present in the submitted sample  additional confirmatory testing may be necessary for epidemiological  and / or clinical management purposes  to differentiate between  SARS-CoV-2 and other Sarbecovirus currently known to infect humans.  If clinically indicated additional testing with an alternate test  methodology [phone]) is advised. The SARS-CoV-2 RNA is generally  detectable in upper and lower respiratory sp ecimens during the acute  phase of infection. The expected result is Negative. Fact Sheet for Patients:  StrictlyIdeas.no Fact Sheet for Healthcare Providers: BankingDealers.co.za This test is not yet approved or cleared by the [place] FDA and has been authorized for detection and/or diagnosis of SARS-CoV-2 by FDA under an Emergency Use Authorization (EUA).  This EUA will remain in effect (meaning this test can be used) for the duration of the COVID-19 declaration under Section 564(b)(1) of the Act, 21 U.S.C. section 360bbb-3(b)(1), unless the authorization is terminated or revoked sooner. Performed at [facility], [address] [place], [place] [address]   Comprehensive metabolic panel     Status: Abnormal   Collection Time: 03/02/19 11:05 AM  Result Value Ref Range   Sodium 145 135 - 145 mmol/L   Potassium 2.6 (LL) 3.5 - 5.1 mmol/L    Comment: CRITICAL RESULT CALLED TO, READ BACK BY AND VERIFIED WITH Gwynn Burly RN AT 8185 ON 03/02/2019 SNG  Chloride 99 98 - 111 mmol/L   CO2 35 (H) 22 - 32 mmol/L   Glucose, Bld 134 (H) 70 - 99 mg/dL   BUN 43 (H) 8 - 23 mg/dL   Creatinine, Ser 1.43 (H) 0.44 - 1.00 mg/dL   Calcium >15.0 (HH) 8.9 - 10.3 mg/dL    Comment: CRITICAL RESULT CALLED TO, READ BACK BY AND VERIFIED WITH Gwynn Burly RN AT 1132 ON 03/02/2019 SNG    Total Protein 7.0 6.5 - 8.1 g/dL   Albumin 3.2 (L) 3.5 -  5.0 g/dL   AST 26 15 - 41 U/L   ALT 13 0 - 44 U/L   Alkaline Phosphatase 69 38 - 126 U/L   Total Bilirubin 1.9 (H) 0.3 - 1.2 mg/dL   GFR calc non Af Amer 32 (L) >60 mL/min   GFR calc Af Amer 37 (L) >60 mL/min   Anion gap 11 5 - 15    Comment: Performed at [facility], [address] [place], [place] [address]  Lactic acid, plasma     Status: Abnormal   Collection Time: 03/02/19  1:01 PM  Result Value Ref Range   Lactic Acid, Venous 3.0 (HH) 0.5 - 1.9 mmol/L    Comment: CRITICAL RESULT CALLED TO, READ BACK BY AND VERIFIED WITH STEPHANIE RUDD@1330 /CHC/03/02/2019 Performed at [facility], [address] [place], [place] [address]   Magnesium     Status: Abnormal   Collection Time: 03/02/19  1:06 PM  Result Value Ref Range   Magnesium 1.5 (L) 1.7 - 2.4 mg/dL    Comment: Performed at [facility], [address] [place], [place] [address]  Glucose, capillary     Status: Abnormal   Collection Time: 03/02/19  4:31 PM  Result Value Ref Range   Glucose-Capillary 112 (H) 70 - 99 mg/dL   Comment 1 Notify RN    Comment 2 Document in Chart   Calcium     Status: Abnormal   Collection Time: 03/02/19  5:07 PM  Result Value Ref Range   Calcium >15.0 (HH) 8.9 - 10.3 mg/dL    Comment: CRITICAL RESULT CALLED TO, READ BACK BY AND VERIFIED WITH JESSICA CHRISTMAS@1738 /CHC/03/02/2019 Performed at [facility], [address] [place], [place] [address]   Glucose, capillary     Status: Abnormal   Collection Time: 03/02/19  9:05 PM  Result Value Ref Range   Glucose-Capillary 116 (H) 70 - 99 mg/dL  Basic metabolic panel     Status: Abnormal   Collection Time: 03/03/19  4:36 AM  Result Value Ref Range   Sodium 148 (H) 135 - 145 mmol/L   Potassium 2.9 (L) 3.5 - 5.1 mmol/L   Chloride 106 98 - 111 mmol/L   CO2 33 (H) 22 - 32 mmol/L   Glucose, Bld 96 70 - 99 mg/dL   BUN 41 (H) 8 - 23 mg/dL   Creatinine, Ser 1.50 (H) 0.44 - 1.00 mg/dL   Calcium >15.0 (HH) 8.9  - 10.3 mg/dL    Comment: CRITICAL RESULT CALLED TO, READ BACK BY AND VERIFIED WITH KAT MURRAY AT 0516 ON 03/03/19 RWW    GFR calc non Af Amer 30 (L) >60 mL/min   GFR calc Af Amer 35 (L) >60 mL/min   Anion gap 9 5 - 15    Comment: Performed at [facility], [address], [place], [place] [address]  CBC     Status: Abnormal   Collection Time: 03/03/19  4:36 AM  Result Value Ref Range   WBC 8.5 4.0 - 10.5 K/uL   RBC 3.19 (L) 3.87 - 5.11 MIL/uL   Hemoglobin 10.3 (L) 12.0 - 15.0 g/dL   HCT 34.9 (L) 36.0 - 46.0 %   MCV 109.4 (H) 80.0 - 100.0 fL   MCH 32.3 26.0 - 34.0 pg   MCHC 29.5 (L) 30.0 - 36.0 g/dL   RDW 20.5 (H) 11.5 - 15.5 %   Platelets 179 150 - 400 K/uL   nRBC 0.6 (H) 0.0 - 0.2 %    Comment: Performed at [facility], [address] [place], [place] [address]  Bilirubin, total     Status: Abnormal   Collection Time: 03/03/19  4:36 AM  Result Value Ref Range   Total Bilirubin 1.3 (H) 0.3 - 1.2 mg/dL    Comment: Performed at [facility], [address] [place], [place] [address]  Bilirubin, direct     Status: Abnormal   Collection Time: 03/03/19  4:36 AM  Result Value Ref Range   Bilirubin, Direct 0.5 (H) 0.0 - 0.2 mg/dL    Comment: Performed at [facility], [address], [place], [place] [address]  Magnesium     Status: None   Collection Time: 03/03/19  4:36 AM  Result Value Ref Range   Magnesium 1.9 1.7 - 2.4 mg/dL    Comment: Performed at [facility], [address] [place], [place] [address]  Lactic acid, plasma     Status: Abnormal   Collection Time: 03/03/19  4:36 AM  Result Value Ref Range   Lactic Acid, Venous 2.7 (HH) 0.5 - 1.9 mmol/L    Comment: CRITICAL RESULT CALLED TO, READ BACK BY AND VERIFIED WITH KAT MURRAY AT 0516 ON 03/03/19 RWW Performed at [facility], [address] [place], [place] [address]   Phosphorus     Status: None   Collection Time: 03/03/19  4:36 AM  Result Value Ref Range    Phosphorus 4.2 2.5 - 4.6 mg/dL    Comment: Performed at [facility], [address] [place], [place] [address]  Glucose, capillary     Status: None   Collection Time: 03/03/19  7:45 AM  Result Value Ref Range   Glucose-Capillary 86 70 - 99 mg/dL   Dg Chest Portable 1 View  Result Date: 03/02/2019 CLINICAL DATA:  Hypoxia, dementia EXAM: PORTABLE CHEST 1 VIEW COMPARISON:  10/06/2018 FINDINGS: Increased patchy bronchovascular airspace process in the left lower lobe obscures the left hemidiaphragm may represent pneumonia. Stable cardiomegaly and chronic bronchitic change/interstitial prominence. No current CHF. Small left effusion not excluded. Right lung grossly clear. Negative for pneumothorax. Trachea midline. Aorta atherosclerotic and degenerative changes noted of the spine and shoulders. Healed deformity of the right proximal humerus. IMPRESSION: Increased left lower lobe bronchovascular airspace process concerning for pneumonia and likely small left effusion. Cardiomegaly Electronically Signed   By: Jerilynn Mages.  Shick M.D.   On: 03/02/2019 11:01    Assessment:  The patient is a 85 y.o. woman with stage III multiple myeloma who was admitted with hypercalcemia.  She was initially treated with weekly Decadron.  She has been on  Decadron and Revlimid since 02/23/2019.    Symptomatically, she has declined over the past few days.  She is poorly responsive.  Calcium is >15 despite Zometa, calcitonin, and IVF.  Plan:   1.   Multiple myeloma  Patient recently diagnosed with multiple myeloma.  She has multiple lytic lesions on bone survey.  Calcium was normal prior to initiation of treatment.  Discuss plan for monthly Zometa or Xgeva after discharge.  Once patient back to baseline, anticipate reinitiation of oral medications   Decadron 20 mg po q week.   Revlimid day 1-21 every 28 days.      Dose adjusted per renal function.      Approximately 30% removed with dialysis. 2.    Hypercalcemia  Etiology likely related to multiple myeloma.  However, hypercalemia started after initiation of treatment.  Check PTH, PTH-rp, 1,25 dihydroxy D, 25 hydroxy D, TSH.  Patient has received IVF, Zometa, and calcitonin without improvement.  Discuss redosing of bisphosphonate can occur weekly if needed.  Discuss with Dr Holley Raring plans for dialysis to decrease calcium.  Discuss with patient's daughter, Juliann Pulse, regarding dialysis decision. 3.   Code status  DNR.  Discussed current clinical situation at length with patient's daughter who plans to talk to her siblings.  Multiple questions asked and answered.   Thank you for allowing me to participate in Danilynn Jemison 's care.  I will follow her closely with you until Dr Collie Siad return on 03/05/2019.   Lequita Asal, MD  03/03/2019, 9:50 AM

## 2019-03-03 NOTE — Op Note (Addendum)
    Patient name: Cassie Huffman MRN: [id number] DOB: 03/05/1927 Sex: female  03/03/2019 Pre-operative Diagnosis: Refractory hypercalcemia Post-operative diagnosis:  Same Surgeon:  Annamarie Major Procedure:   Ultrasound-guided placement of a right femoral vein trialysis catheter  Indications: The patient has developed hypercalcemia refractory to medication.  The family has decided to pursue dialysis.  Procedure:  The patient was identified in the holding area and taken to AR-VAS/IR 1  The patient was then placed supine on the table. local anesthesia was administered.  The patient was prepped and draped in the usual sterile fashion.  A time out was called.  Ultrasound was used to evaluate the right femoral vein which was widely patent and easily compressible.  A digital ultrasound image was acquired.  1% lidocaine was used for local anesthesia.  A 11 blade was used to make a skin nick and the right common femoral vein was cannulated under ultrasound guidance with an 18-gauge needle.  A 035 wire was advanced without resistance.  The subcutaneous tract was dilated with sequential dilators and a trialysis catheter was inserted fully.  All 3 ports flushed and aspirated without difficulty.  The catheter was sutured into position, and sterile dressings were applied.       Theotis Burrow, M.D., [facility] [place] Office: [phone] Pager:  [phone]

## 2019-03-03 NOTE — Progress Notes (Signed)
Assessed veins with ultrasound. No suitable veins for PIV. Started a 24G in right hand. Lab having hard time drawing labs as well. Nurse getting order for PICC from MD and states it is ok with Dr. Holley Raring.

## 2019-03-03 NOTE — Progress Notes (Signed)
  Dr. Holley Raring is OK with a PICC line if we need it.  She will not be a long term dialysis patient.

## 2019-03-03 NOTE — Progress Notes (Signed)
Notified MD of calcium >15. Orders placed

## 2019-03-03 NOTE — Progress Notes (Signed)
[facility] [place] NAME: Cassie Huffman    MR#:  [id number]  DATE OF BIRTH:  1927-02-19  SUBJECTIVE:  CHIEF COMPLAINT:   Chief Complaint  Patient presents with  . Altered Mental Status   The patient is lethargic and unresponsive to verbal stimuli, on oxygen by nasal cannula 4 L. REVIEW OF SYSTEMS:  Review of Systems  Unable to perform ROS: Mental status change    DRUG ALLERGIES:   Allergies  Allergen Reactions  . Tape Hives    Pt states that she gets whelps that spread out from the Bandaid Pt states that she gets whelps that spread out from the Bandaid Pt states that she gets whelps that spread out from the Bandaid Pt states that she gets whelps that spread out from the Bandaid Pt states that she gets whelps that spread out from the Bandaid   VITALS:  Blood pressure 133/70, pulse 80, temperature 98.9 F (37.2 C), temperature source Oral, resp. rate 20, height 4' 11"  (1.499 m), weight 63.8 kg, SpO2 92 %. PHYSICAL EXAMINATION:  Physical Exam Constitutional:      Appearance: She is ill-appearing.  HENT:     Head: Normocephalic.     Mouth/Throat:     Mouth: Mucous membranes are dry.  Eyes:     General: No scleral icterus.    Conjunctiva/sclera: Conjunctivae normal.     Pupils: Pupils are equal, round, and reactive to light.  Neck:     Musculoskeletal: Neck supple.     Vascular: No JVD.     Trachea: No tracheal deviation.  Cardiovascular:     Rate and Rhythm: Normal rate and regular rhythm.     Heart sounds: Normal heart sounds. No murmur. No gallop.   Pulmonary:     Effort: Pulmonary effort is normal. No respiratory distress.     Breath sounds: Normal breath sounds. No wheezing or rales.  Abdominal:     General: Bowel sounds are normal. There is no distension.     Palpations: Abdomen is soft.     Tenderness: There is no abdominal tenderness. There is no rebound.  Musculoskeletal:        General: No tenderness.   Skin:    Findings: No erythema or rash.  Neurological:     Comments: The patient is lethargic and unresponsive to verbal stimuli.  Unable to exam.    LABORATORY PANEL:  Female CBC Recent Labs  Lab 03/03/19 0436  WBC 8.5  HGB 10.3*  HCT 34.9*  PLT 179   ------------------------------------------------------------------------------------------------------------------ Chemistries  Recent Labs  Lab 03/02/19 1105  03/03/19 0436  NA 145  --  148*  K 2.6*  --  2.9*  CL 99  --  106  CO2 35*  --  33*  GLUCOSE 134*  --  96  BUN 43*  --  41*  CREATININE 1.43*  --  1.50*  CALCIUM >15.0*   < > >15.0*  MG  --    < > 1.9  AST 26  --   --   ALT 13  --   --   ALKPHOS 69  --   --   BILITOT 1.9*  --  1.3*   < > = values in this interval not displayed.   RADIOLOGY:  No results found. ASSESSMENT AND PLAN:   Severe hypercalcemia. She was given 1 dose IV Zometa and calcitonin,  She is now IV fluid support, calcium level is still more than  15. Hemodialysis per Dr. Holley Raring.  Severe hypokalemia.    Patient was given potassium IV, potassium up to 2.9.  Follow-up potassium and magnesium level.   Acute renal failure due to dehydration.    Continue IV fluid support and follow-up BMP.  Hold torsemide and metolazone.  Acute respiratory failure with hypoxia due to pneumonia, CAP.  Continue Zithromax and Rocephin, Robitussin as needed. Lactic acidosis due to above.  Continue IV fluid support. Acute metabolic encephalopathy due to above.  Aspiration and fall precaution.  Multiple myeloma.  Hold chemo medication this time. Anemia of chronic disease.  Stable. History of chronic A. fib.  Rate controlled, continue Xarelto and Lopressor. Hypertension, continue Lopressor but hold torsemide and metolazone. Diabetes. on sliding scale, hold metformin. Very poor prognosis and may die soon.  Palliative care consult.  Discussed with Dr. Holley Raring and Dr. Michaela Corner. Discussed hospice care and comfort care  with patient's daughter, who does not want her mother suffer and prefers hospice care or comfort care.she will discuss with her sister about comfort care or hospice care. All the records are reviewed and case discussed with Care Management/Social Worker. Management plans discussed with the patient, family and they are in agreement.  CODE STATUS: DNR  TOTAL TIME TAKING CARE OF THIS PATIENT: 38 minutes.   More than 50% of the time was spent in counseling/coordination of care: YES  POSSIBLE D/C IN ? DAYS, DEPENDING ON CLINICAL CONDITION.   Demetrios Loll M.D on 03/03/2019 at 11:44 AM  Between 7am to 6pm - Pager - [phone]  After 6pm go to [email or web address] - [facility]

## 2019-03-03 NOTE — Consult Note (Signed)
PHARMACY CONSULT NOTE - FOLLOW UP  Pharmacy Consult for Electrolyte Monitoring and Replacement   Recent Labs: Potassium (mmol/L)  Date Value  03/03/2019 2.9 (L)  09/13/2014 3.6   Magnesium (mg/dL)  Date Value  03/03/2019 1.9   Calcium (mg/dL)  Date Value  03/03/2019 >15.0 (HH)   Calcium, Total (mg/dL)  Date Value  09/13/2014 8.7   Albumin (g/dL)  Date Value  03/02/2019 3.2 (L)  09/13/2014 3.5   Phosphorus (mg/dL)  Date Value  03/03/2019 4.2   Sodium (mmol/L)  Date Value  03/03/2019 148 (H)  09/13/2014 136     Assessment: Pharmacy has been consulted to monitor and replenish electrolytes in 85yo patient.  7/3: K 2.6, Mag 1.5, Ca>15   Goal of Therapy:  Electrolytes WNL  Plan:  07/04 @ 0500 K 2.9, Mag 1.9, phos 4.2, Ca > 15, CCa > 15. Hospitalist would like to treat patient's hypercalcemia of malignancy (patient has MM) w/ pamidronate. Will give pamidronate 90 mg IV x 1 over 4 hour infusion. Given pamidronate can cause hypokalemia, hypomagnesemia, hypophosphatemia and given patient is already hypokalemic will administer KCI 10 mEq IV x 5 and Mag 2g IV x 1 PRIOR to administration of pamidronate and will monitor electrolytes post-replacement @ 1200 and w/ am labs. Informed RN to administer KCI BEFORE giving pamidronate and explained that will be going over 4 hours considering drug can cause injection site reactions, dysphagia, and possible anaphylaxis.  Will check levels with AM labs and supplement as needed.  Tobie Lords ,PharmD Clinical Pharmacist 03/03/2019 6:55 AM

## 2019-03-03 NOTE — Progress Notes (Signed)
HD Tx Start   03/03/19 1520  Hand-Off documentation  Report given to (Full Name) Trellis Paganini RN  Report received from (Full Name) Bethel Born RN 2A  Vital Signs  Temp 98.6 F (37 C)  Temp Source Oral  Pulse Rate 70  Pulse Rate Source Monitor  Resp 20  BP 118/88  BP Location Right Arm  BP Method Automatic  Patient Position (if appropriate) Lying  Oxygen Therapy  SpO2 100 %  O2 Device Nasal Cannula  O2 Flow Rate (L/min) 3 L/min  Pain Assessment  Pain Scale CPOT  Critical Care Pain Observation Tool (CPOT)  Facial Expression 0  Body Movements 0  Muscle Tension 1  Compliance with ventilator (intubated pts.) N/A  Vocalization (extubated pts.) 0  CPOT Total 1  Dialysis Weight  Weight 63.8 kg  Type of Weight Pre-Dialysis  Time-Out for Hemodialysis  What Procedure? Hemodialysis   Pt Identifiers(min of two) First/Last Name;MRN/Account#  Correct Site? Yes  Correct Side? Yes  Correct Procedure? Yes  Consents Verified? Yes  Rad Studies Available? N/A  Safety Precautions Reviewed? Yes  Engineer, civil (consulting) Number 2  Station Number 4  UF/Alarm Test Passed  Conductivity: Meter 14  Conductivity: Machine  13.8  pH 7.2  Reverse Osmosis Main  Normal Saline Lot Number [id number]  Dialyzer Lot Number [id number]  Disposable Set Lot Number [id number]  Machine Temperature 98.6 F (37 C)  Musician and Audible Yes  Blood Lines Intact and Secured Yes  Pre Treatment Patient Checks  Vascular access used during treatment Catheter  HD catheter dressing before treatment WDL  Patient is receiving dialysis in a chair Yes  Hepatitis B Surface Antigen Results Pending  Date Hepatitis B Surface Antibody Drawn 03/03/19  Hemodialysis Consent Verified Yes  Hemodialysis Standing Orders Initiated Yes  ECG (Telemetry) Monitor On Yes  Prime Ordered Normal Saline  Length of  DialysisTreatment -hour(s) 2.5 Hour(s)  Dialysis Treatment Comments Na 140  Dialyzer Elisio 17H NR  Dialysate 3K;2.5 Ca   Dialysate Flow Ordered 500  Blood Flow Rate Ordered 250 mL/min  Ultrafiltration Goal 0 Liters  Dialysis Blood Pressure Support Ordered Normal Saline  During Hemodialysis Assessment  Blood Flow Rate (mL/min) 250 mL/min  Arterial Pressure (mmHg) -110 mmHg  Venous Pressure (mmHg) 80 mmHg  Transmembrane Pressure (mmHg) 50 mmHg  Ultrafiltration Rate (mL/min) 170 mL/min  Dialysate Flow Rate (mL/min) 500 ml/min  Conductivity: Machine  14  HD Safety Checks Performed Yes  Dialysis Fluid Bolus Normal Saline  Bolus Amount (mL) 250 mL  Intra-Hemodialysis Comments Tx initiated  Education / Care Plan  Dialysis Education Provided No (Comment) (pt not coherent enough for education at this time)  Hemodialysis Catheter Right Femoral vein Triple lumen Temporary (Non-Tunneled)  Placement Date/Time: 03/03/19 1357   Time Out: Correct patient;Correct site;Correct procedure  Maximum sterile barrier precautions: Hand hygiene;Cap;Sterile gown;Mask;Large sterile sheet;Sterile gloves  Site Prep: Chlorhexidine (preferred);Skin Prep C...  Site Condition No complications  Blue Lumen Status Infusing  Red Lumen Status Infusing  Dressing Type Gauze/Drain sponge  Drainage Description None  Dressing Change Due 03/04/19

## 2019-03-03 NOTE — Progress Notes (Signed)
Aredia not given on instructions from Dr. Nolon Stalls.

## 2019-03-03 NOTE — Progress Notes (Addendum)
Patient responsive only to pain.  Unable to give oral medications at this time.  Dr.Chen alerted.

## 2019-03-03 NOTE — Consult Note (Signed)
[facility] CONSULT NOTE    Date: 03/03/2019                  Patient Name:  Cassie Huffman  MRN: [id number]  DOB: Jan 05, 1927  Age / Sex: 85 y.o., female         PCP: Kirk Ruths, MD                 Service Requesting Consult:  Hospitalist                 Reason for Consult:  Severe hypercalcemia, acute renal failure            History of Present Illness: Patient is a 85 y.o. female with a PMHx of atrial fibrillation, breast cancer, congestive heart failure, diabetes mellitus type 2, hypertension, multiple myeloma, who was admitted to [facility] on 03/02/2019 for evaluation of altered mental status and confusion for the past 2 days.  She has been undergoing chemotherapy for multiple myeloma.  She has now developed severe hypercalcemia with a calcium greater than 5.  Conservative measures were employed with the use of Zometa as well as calcitonin and IV fluid hydration.  Despite these measures however serum calcium remains greater than 15.  We had a long discussion with hematology as well as the patient's daughter.  They do agree to proceeding with renal placement therapy.  Appreciate assistance from vascular surgery.   Medications: Outpatient medications: Medications Prior to Admission  Medication Sig Dispense Refill Last Dose  . dexamethasone (DECADRON) 4 MG tablet Take 5 tablets (20 mg total) by mouth once a week. (Patient taking differently: Take 20 mg by mouth every Sunday. ) 100 tablet 0 02/25/2019 at 0800  . diclofenac sodium (VOLTAREN) 1 % GEL Apply 1 application topically as needed.   Unknown at PRN  . ferrous sulfate 325 (65 FE) MG tablet Take 325 mg by mouth 2 (two) times daily with a meal.    03/01/2019 at 0800  . lenalidomide (REVLIMID) 5 MG capsule Take 1 capsule (5 mg total) by mouth daily. Take for 21 days, then hold for 7 days. Repeat every 28 days. 21 capsule 0 Past Week at Unknown time  . loperamide (IMODIUM) 2 MG capsule Take 1 capsule (2 mg total)  by mouth See admin instructions. With onset of loose stool, take 68m followed by 253mevery 2 hours until 12 hours have passed without loose bowel movement. Maximum: 16 mg/day 120 capsule 1 Unknown at PRN  . magnesium chloride (SLOW-MAG) 64 MG TBEC SR tablet Take 1 tablet (64 mg total) by mouth daily. 30 tablet 3 03/01/2019 at 0800  . meclizine (ANTIVERT) 25 MG tablet Take 12.5-25 mg by mouth every 4 (four) hours as needed for dizziness or nausea.    Unknown at PRN  . metFORMIN (GLUCOPHAGE) 500 MG tablet Take 500 mg by mouth daily.   03/01/2019 at 0800  . metolazone (ZAROXOLYN) 2.5 MG tablet Take 2.5 mg by mouth daily as needed (feet swelling).    Unknown at PRN  . metoprolol succinate (TOPROL-XL) 25 MG 24 hr tablet Take 25 mg by mouth at bedtime.    03/01/2019 at 2000  . Multiple Vitamins-Minerals (PRESERVISION AREDS 2) CAPS Take 1 capsule by mouth 2 (two) times daily.   03/01/2019 at 0800  . nitrofurantoin, macrocrystal-monohydrate, (MACROBID) 100 MG capsule Take 100 mg by mouth 2 (two) times a day.   03/01/2019 at 2000  . ondansetron (ZOFRAN) 8 MG tablet Take 1  tablet (8 mg total) by mouth every 8 (eight) hours as needed for nausea, vomiting or refractory nausea / vomiting. 90 tablet 2 03/01/2019 at PRN  . potassium chloride (K-DUR) 10 MEQ tablet Take 10 mEq by mouth 2 (two) times daily.   03/01/2019 at 0800  . torsemide (DEMADEX) 10 MG tablet Take 10 mg by mouth daily.   03/01/2019 at 0800  . traMADol (ULTRAM) 50 MG tablet Take 50-100 mg by mouth every 6 (six) hours as needed for pain.   Unknown at PRN  . venlafaxine XR (EFFEXOR-XR) 37.5 MG 24 hr capsule Take 75 mg by mouth daily.    03/01/2019 at 0800  . XARELTO 10 MG TABS tablet Take 10 mg by mouth daily.    03/01/2019 at 0800  . famotidine (PEPCID) 20 MG tablet Take 1 tablet (20 mg total) by mouth daily. (Patient not taking: Reported on 03/02/2019) 30 tablet 1 Not Taking at Unknown time    Current medications: Current Facility-Administered Medications  Medication  Dose Route Frequency Provider Last Rate Last Dose  . 0.45 % sodium chloride infusion   Intravenous Continuous Demetrios Loll, MD 125 mL/hr at 03/03/19 0850    . [facility] Hold] 0.9 %  sodium chloride infusion   Intravenous PRN Demetrios Loll, MD 10 mL/hr at 03/03/19 0749 50 mL at 03/03/19 0749  . [MAR Hold] 0.9 %  sodium chloride infusion   Intravenous PRN Demetrios Loll, MD   Stopped at 03/03/19 1246  . [MAR Hold] acetaminophen (TYLENOL) tablet 650 mg  650 mg Oral Q6H PRN Demetrios Loll, MD       Or  . [facility] Hold] acetaminophen (TYLENOL) suppository 650 mg  650 mg Rectal Q6H PRN Demetrios Loll, MD      . [facility] Hold] albuterol (PROVENTIL) (2.5 MG/3ML) 0.083% nebulizer solution 2.5 mg  2.5 mg Nebulization Q2H PRN Demetrios Loll, MD      . [facility] Hold] azithromycin [facility]) 500 mg in sodium chloride 0.9 % 250 mL IVPB  500 mg Intravenous Q24H Demetrios Loll, MD   Stopped at 03/03/19 1314  . [MAR Hold] bisacodyl (DULCOLAX) EC tablet 5 mg  5 mg Oral Daily PRN Demetrios Loll, MD      . [facility] Hold] calcitonin (MIACALCIN) injection 282 Units  4 Units/kg Intramuscular BID Nance Pear, MD   282 Units at 03/03/19 1303  . [MAR Hold] cefTRIAXone (ROCEPHIN) 2 g in sodium chloride 0.9 % 100 mL IVPB  2 g Intravenous Q24H Demetrios Loll, MD   Stopped at 03/03/19 1246  . [MAR Hold] chlorhexidine (PERIDEX) 0.12 % solution 15 mL  15 mL Mouth Rinse BID Demetrios Loll, MD   15 mL at 03/03/19 1030  . [MAR Hold] cholestyramine (QUESTRAN) packet 4 g  4 g Oral Daily Demetrios Loll, MD      . [facility] Hold] dexamethasone (DECADRON) tablet 20 mg  20 mg Oral Weekly Demetrios Loll, MD      . [facility] Hold] famotidine (PEPCID) tablet 20 mg  20 mg Oral Daily Demetrios Loll, MD      . [facility] Hold] ferrous sulfate tablet 325 mg  325 mg Oral BID WC Demetrios Loll, MD      . [facility] Hold] guaiFENesin-dextromethorphan (ROBITUSSIN DM) 100-10 MG/5ML syrup 5 mL  5 mL Oral Q4H PRN Demetrios Loll, MD      . [facility] Hold] HYDROcodone-acetaminophen (NORCO/VICODIN) 5-325 MG per tablet 1 tablet  1 tablet Oral Q4H PRN  Demetrios Loll, MD      . [facility] Hold] insulin  aspart (novoLOG) injection 0-5 Units  0-5 Units Subcutaneous QHS Demetrios Loll, MD      . [facility] Hold] insulin aspart (novoLOG) injection 0-9 Units  0-9 Units Subcutaneous TID WC Demetrios Loll, MD      . [facility] Hold] loperamide (IMODIUM) capsule 4 mg  4 mg Oral Once Demetrios Loll, MD      . [facility] Hold] magnesium chloride (SLOW-MAG) 64 MG SR tablet 64 mg  1 tablet Oral Daily Demetrios Loll, MD      . [facility] Hold] MEDLINE mouth rinse  15 mL Mouth Rinse q12n4p Demetrios Loll, MD   15 mL at 03/03/19 1307  . [MAR Hold] metoprolol succinate (TOPROL-XL) 24 hr tablet 25 mg  25 mg Oral QHS Demetrios Loll, MD      . [facility] Hold] multivitamin-lutein (OCUVITE-LUTEIN) capsule 1 capsule  1 capsule Oral BID Demetrios Loll, MD      . [facility] Hold] ondansetron [facility]) tablet 4 mg  4 mg Oral Q6H PRN Demetrios Loll, MD       Or  . [facility] Hold] ondansetron [facility]) injection 4 mg  4 mg Intravenous Q6H PRN Demetrios Loll, MD      . [facility] Hold] potassium chloride (K-DUR) CR tablet 10 mEq  10 mEq Oral BID Demetrios Loll, MD      . [facility] Hold] Rivaroxaban [facility]) tablet 15 mg  15 mg Oral Daily Demetrios Loll, MD      . [facility] Hold] senna-docusate (Senokot-S) tablet 1 tablet  1 tablet Oral QHS PRN Demetrios Loll, MD      . [facility] Hold] venlafaxine XR (EFFEXOR-XR) 24 hr capsule 75 mg  75 mg Oral Daily Demetrios Loll, MD          Allergies: Allergies  Allergen Reactions  . Tape Hives    Pt states that she gets whelps that spread out from the Bandaid Pt states that she gets whelps that spread out from the Bandaid Pt states that she gets whelps that spread out from the Bandaid Pt states that she gets whelps that spread out from the Bandaid Pt states that she gets whelps that spread out from the Bandaid      Past Medical History: Past Medical History:  Diagnosis Date  . A-fib ([place])   . Breast cancer ([place])   . Cancer ([place])   . CHF (congestive heart failure) ([place])   . Diabetes mellitus without complication ([place])   . Hypertension   .  Multiple myeloma ([place])   . Umbilical hernia      Past Surgical History: Past Surgical History:  Procedure Laterality Date  . bilateral breast surgery  2000     Family History: Family History  Problem Relation Age of Onset  . Stroke Father   . Breast cancer Sister      Social History: Social History   Socioeconomic History  . Marital status: Single    Spouse name: Not on file  . Number of children: Not on file  . Years of education: Not on file  . Highest education level: Not on file  Occupational History  . Not on file  Social Needs  . Financial resource strain: Not on file  . Food insecurity    Worry: Not on file    Inability: Not on file  . Transportation needs    Medical: Not on file    Non-medical: Not on file  Tobacco Use  . Smoking status: Never Smoker  . Smokeless tobacco: Never Used  Substance and Sexual Activity  .  Alcohol use: No  . Drug use: Never  . Sexual activity: Not on file  Lifestyle  . Physical activity    Days per week: Not on file    Minutes per session: Not on file  . Stress: Not on file  Relationships  . Social Herbalist on phone: Not on file    Gets together: Not on file    Attends religious service: Not on file    Active member of club or organization: Not on file    Attends meetings of clubs or organizations: Not on file    Relationship status: Not on file  . Intimate partner violence    Fear of current or ex partner: Not on file    Emotionally abused: Not on file    Physically abused: Not on file    Forced sexual activity: Not on file  Other Topics Concern  . Not on file  Social History Narrative  . Not on file     Review of Systems: Patient unable to provide.  Vital Signs: Blood pressure 135/67, pulse 68, temperature 98.4 F (36.9 C), temperature source Oral, resp. rate (!) 22, height 4' 11" (1.499 m), weight 63.8 kg, SpO2 100 %.  Weight trends: Filed Weights   03/02/19 0957 03/03/19 0218  Weight:  70.3 kg 63.8 kg    Physical Exam: General: Critically ill appearing  Head: Normocephalic, atraumatic.  Eyes: Anicteric, EOMI  Nose: Mucous membranes dry, not inflammed, nonerythematous.  Throat: Oropharynx nonerythematous, no exudate appreciated.  Oral mucosa very dry.  Neck: Supple, trachea midline.  Lungs:  Normal respiratory effort. Clear to auscultation BL without crackles or wheezes.  Heart: S1S2 no rubs  Abdomen:  BS normoactive. Soft, Nondistended, non-tender.  No masses or organomegaly.  Extremities: No pretibial edema.  Neurologic: Obtunded  Skin: No visible rashes, scars.    Lab results: Basic Metabolic Panel: Recent Labs  Lab 03/02/19 1105 03/02/19 1306 03/02/19 1707 03/03/19 0436  NA 145  --   --  148*  K 2.6*  --   --  2.9*  CL 99  --   --  106  CO2 35*  --   --  33*  GLUCOSE 134*  --   --  96  BUN 43*  --   --  41*  CREATININE 1.43*  --   --  1.50*  CALCIUM >15.0*  --  >15.0* >15.0*  MG  --  1.5*  --  1.9  PHOS  --   --   --  4.2    Liver Function Tests: Recent Labs  Lab 03/02/19 1105 03/03/19 0436  AST 26  --   ALT 13  --   ALKPHOS 69  --   BILITOT 1.9* 1.3*  PROT 7.0  --   ALBUMIN 3.2*  --    No results for input(s): LIPASE, AMYLASE in the last 168 hours. No results for input(s): AMMONIA in the last 168 hours.  CBC: Recent Labs  Lab 03/02/19 1005 03/03/19 0436  WBC 9.9 8.5  HGB 10.3* 10.3*  HCT 33.9* 34.9*  MCV 105.6* 109.4*  PLT 221 179    Cardiac Enzymes: No results for input(s): CKTOTAL, CKMB, CKMBINDEX, TROPONINI in the last 168 hours.  BNP: Invalid input(s): POCBNP  CBG: Recent Labs  Lab 03/02/19 1631 03/02/19 2105 03/03/19 0745 03/03/19 1133  GLUCAP 112* 116* 86 12    Microbiology: Results for orders placed or performed during the hospital encounter of 03/02/19  Blood Culture (  routine x 2)     Status: None (Preliminary result)   Collection Time: 03/02/19 10:06 AM   Specimen: BLOOD  Result Value Ref Range  Status   Specimen Description BLOOD BLOOD RIGHT WRIST  Final   Special Requests   Final    BOTTLES DRAWN AEROBIC AND ANAEROBIC Blood Culture results may not be optimal due to an inadequate volume of blood received in culture bottles   Culture   Final    NO GROWTH < 24 HOURS Performed at [facility], [address] [place], [place] [address]    Report Status PENDING  Incomplete  Blood Culture (routine x 2)     Status: None (Preliminary result)   Collection Time: 03/02/19 10:19 AM   Specimen: BLOOD  Result Value Ref Range Status   Specimen Description BLOOD LEFT ANTECUBITAL  Final   Special Requests   Final    BOTTLES DRAWN AEROBIC AND ANAEROBIC Blood Culture results may not be optimal due to an inadequate volume of blood received in culture bottles   Culture   Final    NO GROWTH < 24 HOURS Performed at [facility], [address] [place], [place] [address]    Report Status PENDING  Incomplete  SARS Coronavirus 2 (CEPHEID- Performed in [place] hospital lab), Hosp Order     Status: None   Collection Time: 03/02/19 10:20 AM   Specimen: Nasopharyngeal Swab  Result Value Ref Range Status   SARS Coronavirus 2 NEGATIVE NEGATIVE Final    Comment: (NOTE) If result is NEGATIVE SARS-CoV-2 target nucleic acids are NOT DETECTED. The SARS-CoV-2 RNA is generally detectable in upper and lower  respiratory specimens during the acute phase of infection. The lowest  concentration of SARS-CoV-2 viral copies this assay can detect is 250  copies / mL. A negative result does not preclude SARS-CoV-2 infection  and should not be used as the sole basis for treatment or other  patient management decisions.  A negative result may occur with  improper specimen collection / handling, submission of specimen other  than nasopharyngeal swab, presence of viral mutation(s) within the  areas targeted by this assay, and inadequate number of viral copies  (<250 copies / mL). A negative  result must be combined with clinical  observations, patient history, and epidemiological information. If result is POSITIVE SARS-CoV-2 target nucleic acids are DETECTED. The SARS-CoV-2 RNA is generally detectable in upper and lower  respiratory specimens dur ing the acute phase of infection.  Positive  results are indicative of active infection with SARS-CoV-2.  Clinical  correlation with patient history and other diagnostic information is  necessary to determine patient infection status.  Positive results do  not rule out bacterial infection or co-infection with other viruses. If result is PRESUMPTIVE POSTIVE SARS-CoV-2 nucleic acids MAY BE PRESENT.   A presumptive positive result was obtained on the submitted specimen  and confirmed on repeat testing.  While 2019 novel coronavirus  (SARS-CoV-2) nucleic acids may be present in the submitted sample  additional confirmatory testing may be necessary for epidemiological  and / or clinical management purposes  to differentiate between  SARS-CoV-2 and other Sarbecovirus currently known to infect humans.  If clinically indicated additional testing with an alternate test  methodology [phone]) is advised. The SARS-CoV-2 RNA is generally  detectable in upper and lower respiratory sp ecimens during the acute  phase of infection. The expected result is Negative. Fact Sheet for Patients:  StrictlyIdeas.no Fact Sheet for Healthcare Providers: BankingDealers.co.za  This test is not yet approved or cleared by the [place] and has been authorized for detection and/or diagnosis of SARS-CoV-2 by FDA under an Emergency Use Authorization (EUA).  This EUA will remain in effect (meaning this test can be used) for the duration of the COVID-19 declaration under Section 564(b)(1) of the Act, 21 U.S.C. section 360bbb-3(b)(1), unless the authorization is terminated or revoked sooner. Performed at  [facility], [address] [place], [place] [address]     Coagulation Studies: No results for input(s): LABPROT, INR in the last 72 hours.  Urinalysis: Recent Labs    03/02/19 1005  COLORURINE YELLOW*  LABSPEC 1.012  PHURINE 5.0  GLUCOSEU NEGATIVE  HGBUR NEGATIVE  BILIRUBINUR NEGATIVE  KETONESUR NEGATIVE  PROTEINUR NEGATIVE  NITRITE NEGATIVE  LEUKOCYTESUR TRACE*      Imaging: Dg Chest Portable 1 View  Result Date: 03/02/2019 CLINICAL DATA:  Hypoxia, dementia EXAM: PORTABLE CHEST 1 VIEW COMPARISON:  10/06/2018 FINDINGS: Increased patchy bronchovascular airspace process in the left lower lobe obscures the left hemidiaphragm may represent pneumonia. Stable cardiomegaly and chronic bronchitic change/interstitial prominence. No current CHF. Small left effusion not excluded. Right lung grossly clear. Negative for pneumothorax. Trachea midline. Aorta atherosclerotic and degenerative changes noted of the spine and shoulders. Healed deformity of the right proximal humerus. IMPRESSION: Increased left lower lobe bronchovascular airspace process concerning for pneumonia and likely small left effusion. Cardiomegaly Electronically Signed   By: Jerilynn Mages.  Shick M.D.   On: 03/02/2019 11:01   [place] Ekg Site Rite  Result Date: 03/03/2019 If Site Rite image not attached, placement could not be confirmed due to current cardiac rhythm.     Assessment & Plan: Pt is a 85 y.o. female  with a PMHx of atrial fibrillation, breast cancer, congestive heart failure, diabetes mellitus type 2, hypertension, multiple myeloma, who was admitted to [facility] on 03/02/2019 for evaluation of altered mental status and confusion for the past 2 days.   1.  Severe hypercalcemia with calcium greater than 15 upon presentation. 2.  Multiple myeloma with recent initiation of chemotherapy. 3.  Acute renal failure. 4.  Altered mental status likely secondary to hypercalcemia.  Plan: We were consulted for evaluation  management of severe hypercalcemia.  She received Zometa as well as calcitonin yesterday.  She was also started on IV fluid hydration.  Despite these measures serum calcium still greater than 15.  Therefore the next course of action is dialysis.  Appreciate vascular surgery assistance with placement of temporary dialysis catheter.  We will plan for hemodialysis today for 2.5 hours.  We will monitor serum calcium over the course of the hospitalization.  Prognosis guarded at the moment.

## 2019-03-04 LAB — GLUCOSE, CAPILLARY
Glucose-Capillary: 102 mg/dL — ABNORMAL HIGH (ref 70–99)
Glucose-Capillary: 103 mg/dL — ABNORMAL HIGH (ref 70–99)
Glucose-Capillary: 114 mg/dL — ABNORMAL HIGH (ref 70–99)
Glucose-Capillary: 124 mg/dL — ABNORMAL HIGH (ref 70–99)
Glucose-Capillary: 125 mg/dL — ABNORMAL HIGH (ref 70–99)
Glucose-Capillary: 126 mg/dL — ABNORMAL HIGH (ref 70–99)
Glucose-Capillary: 61 mg/dL — ABNORMAL LOW (ref 70–99)
Glucose-Capillary: 73 mg/dL (ref 70–99)
Glucose-Capillary: 79 mg/dL (ref 70–99)
Glucose-Capillary: 91 mg/dL (ref 70–99)

## 2019-03-04 LAB — RENAL FUNCTION PANEL
Albumin: 2.4 g/dL — ABNORMAL LOW (ref 3.5–5.0)
Albumin: 2.2 g/dL — ABNORMAL LOW (ref 3.5–5.0)
Anion gap: 5 (ref 5–15)
Anion gap: 3 — ABNORMAL LOW (ref 5–15)
BUN: 26 mg/dL — ABNORMAL HIGH (ref 8–23)
BUN: 11 mg/dL (ref 8–23)
CO2: 30 mmol/L (ref 22–32)
CO2: 33 mmol/L — ABNORMAL HIGH (ref 22–32)
Calcium: 12.9 mg/dL — ABNORMAL HIGH (ref 8.9–10.3)
Calcium: 9.5 mg/dL (ref 8.9–10.3)
Chloride: 103 mmol/L (ref 98–111)
Chloride: 102 mmol/L (ref 98–111)
Creatinine, Ser: 1.36 mg/dL — ABNORMAL HIGH (ref 0.44–1.00)
Creatinine, Ser: 0.61 mg/dL (ref 0.44–1.00)
GFR calc Af Amer: 40 mL/min — ABNORMAL LOW (ref >60)
GFR calc Af Amer: >60 mL/min (ref >60)
GFR calc non Af Amer: 34 mL/min — ABNORMAL LOW (ref >60)
GFR calc non Af Amer: >60 mL/min (ref >60)
Glucose, Bld: 105 mg/dL — ABNORMAL HIGH (ref 70–99)
Glucose, Bld: 116 mg/dL — ABNORMAL HIGH (ref 70–99)
Phosphorus: 2 mg/dL — ABNORMAL LOW (ref 2.5–4.6)
Phosphorus: 1.5 mg/dL — ABNORMAL LOW (ref 2.5–4.6)
Potassium: 4 mmol/L (ref 3.5–5.1)
Potassium: 3.5 mmol/L (ref 3.5–5.1)
Sodium: 138 mmol/L (ref 135–145)
Sodium: 138 mmol/L (ref 135–145)

## 2019-03-04 LAB — LACTIC ACID, PLASMA: Lactic Acid, Venous: 2.1 mmol/L (ref 0.5–1.9)

## 2019-03-04 LAB — MAGNESIUM: Magnesium: 1.2 mg/dL — ABNORMAL LOW (ref 1.7–2.4)

## 2019-03-04 MED ORDER — ENOXAPARIN SODIUM 40 MG/0.4ML ~~LOC~~ SOLN: 40.0000 mg | SUBCUTANEOUS | Status: DC

## 2019-03-04 MED ORDER — DEXTROSE 10 % IV SOLN
INTRAVENOUS | Status: DC
  Administered 2019-03-04 (×2): via INTRAVENOUS

## 2019-03-04 MED ORDER — MORPHINE SULFATE (PF) 4 MG/ML IV SOLN
4.0000 mg | INTRAVENOUS | Status: DC | PRN
  Administered 2019-03-04 (×2): 4 mg via INTRAVENOUS
  Filled 2019-03-04 (×2): qty 1

## 2019-03-04 MED ORDER — SODIUM PHOSPHATES 45 MMOLE/15ML IV SOLN
10.0000 mmol | Freq: Once | INTRAVENOUS | Status: AC
  Administered 2019-03-04: 10 mmol via INTRAVENOUS
  Filled 2019-03-04: qty 3.33

## 2019-03-04 MED ORDER — MAGNESIUM SULFATE 4 GM/100ML IV SOLN
4.0000 g | Freq: Once | INTRAVENOUS | Status: AC
  Administered 2019-03-04: 4 g via INTRAVENOUS
  Filled 2019-03-04: qty 100

## 2019-03-04 MED ORDER — DEXTROSE 50 % IV SOLN
12.5000 g | INTRAVENOUS | Status: AC
  Administered 2019-03-04: 12.5 g via INTRAVENOUS
  Filled 2019-03-04: qty 50

## 2019-03-04 MED ORDER — ENOXAPARIN SODIUM 30 MG/0.3ML ~~LOC~~ SOLN
30.0000 mg | SUBCUTANEOUS | Status: DC
  Administered 2019-03-04: 30 mg via SUBCUTANEOUS
  Filled 2019-03-04 (×2): qty 0.3

## 2019-03-04 MED ORDER — SODIUM CHLORIDE 0.45 % IV SOLN
INTRAVENOUS | Status: DC
  Administered 2019-03-04: 03:00:00 via INTRAVENOUS

## 2019-03-04 MED ORDER — DEXTROSE-NACL 5-0.45 % IV SOLN
INTRAVENOUS | Status: DC
  Administered 2019-03-04 – 2019-03-05 (×4): via INTRAVENOUS

## 2019-03-04 NOTE — Progress Notes (Addendum)
[facility] [place] NAME: Cassie Huffman    MR#:  [id number]  DATE OF BIRTH:  09-Jul-1927  SUBJECTIVE:  CHIEF COMPLAINT:   Chief Complaint  Patient presents with  . Altered Mental Status   The patient is still  lethargic and unresponsive to verbal stimuli, on oxygen by nasal cannula 3 L. S/p HD, Ca down to 12.9. REVIEW OF SYSTEMS:  Review of Systems  Unable to perform ROS: Mental status change    DRUG ALLERGIES:   Allergies  Allergen Reactions  . Tape Hives    Pt states that she gets whelps that spread out from the Bandaid Pt states that she gets whelps that spread out from the Bandaid Pt states that she gets whelps that spread out from the Bandaid Pt states that she gets whelps that spread out from the Bandaid Pt states that she gets whelps that spread out from the Bandaid   VITALS:  Blood pressure (!) 105/52, pulse 80, temperature 98 F (36.7 C), temperature source Oral, resp. rate 19, height 4' 11"  (1.499 m), weight 66.7 kg, SpO2 100 %. PHYSICAL EXAMINATION:  Physical Exam Constitutional:      Appearance: She is ill-appearing.  HENT:     Head: Normocephalic.     Mouth/Throat:     Mouth: Mucous membranes are dry.  Eyes:     General: No scleral icterus.    Conjunctiva/sclera: Conjunctivae normal.     Pupils: Pupils are equal, round, and reactive to light.  Neck:     Musculoskeletal: Neck supple.     Vascular: No JVD.     Trachea: No tracheal deviation.  Cardiovascular:     Rate and Rhythm: Normal rate and regular rhythm.     Heart sounds: Normal heart sounds. No murmur. No gallop.   Pulmonary:     Effort: Pulmonary effort is normal. No respiratory distress.     Breath sounds: Normal breath sounds. No wheezing or rales.  Abdominal:     General: Bowel sounds are normal. There is no distension.     Palpations: Abdomen is soft.     Tenderness: There is no abdominal tenderness. There is no rebound.  Musculoskeletal:         General: No tenderness.  Skin:    Findings: No erythema or rash.  Neurological:     Comments: The patient is lethargic and unresponsive to verbal stimuli.  Unable to exam.    LABORATORY PANEL:  Female CBC Recent Labs  Lab 03/03/19 0436  WBC 8.5  HGB 10.3*  HCT 34.9*  PLT 179   ------------------------------------------------------------------------------------------------------------------ Chemistries  Recent Labs  Lab 03/02/19 1105  03/03/19 0436  03/04/19 0609  NA 145  --  148*   < > 138  K 2.6*  --  2.9*   < > 4.0  CL 99  --  106   < > 103  CO2 35*  --  33*   < > 30  GLUCOSE 134*  --  96   < > 105*  BUN 43*  --  41*   < > 26*  CREATININE 1.43*  --  1.50*   < > 1.36*  CALCIUM >15.0*   < > >15.0*   < > 12.9*  MG  --    < > 1.9   < > 1.2*  AST 26  --   --   --   --   ALT 13  --   --   --   --  ALKPHOS 69  --   --   --   --   BILITOT 1.9*  --  1.3*  --   --    < > = values in this interval not displayed.   RADIOLOGY:  [place] Ekg Site Rite  Result Date: 03/03/2019 If Site Rite image not attached, placement could not be confirmed due to current cardiac rhythm.  ASSESSMENT AND PLAN:   Severe hypercalcemia. She was given 1 dose IV Zometa and calcitonin,  She is now IV fluid support, calcium level is still more than 15. Ca down to 12.9 after HD.  Severe hypokalemia.    Patient was given potassium IV, potassium up to 2.9.  Improved with supplement. Hypomagnesemia.  IV magnesium and follow-up level.  Acute renal failure due to dehydration.    Continue IV fluid support and follow-up BMP.  Hold torsemide and metolazone.  Acute respiratory failure with hypoxia due to pneumonia, CAP.  Continue Zithromax and Rocephin, Robitussin as needed. Lactic acidosis due to above.  Improving with IV fluid support. Acute metabolic encephalopathy due to above.  Aspiration and fall precaution.  Multiple myeloma and breast cancer.  Hold chemo medication this time. Anemia of chronic  disease.  Stable. History of chronic A. fib.  Rate controlled, hold Xarelto since the patient cannot take po medication. Lovenox for DVT prophylaxis. Hold Lopressor due to softer blood pressure. Hypertension, hold Lopressor, torsemide and metolazone.  BP is soft. Diabetes. on sliding scale, hold metformin. Very poor prognosis and may die soon.  Palliative care consult.  Discussed hospice care and comfort care with patient's daughter. She does not want her mother suffer and prefers hospice care.  She will discuss with Dr. Holley Raring about hemodialysis.  All the records are reviewed and case discussed with Care Management/Social Worker. Management plans discussed with the patient, family and they are in agreement.  CODE STATUS: DNR  TOTAL TIME TAKING CARE OF THIS PATIENT: 32 minutes.   More than 50% of the time was spent in counseling/coordination of care: YES  POSSIBLE D/C IN ? DAYS, DEPENDING ON CLINICAL CONDITION.   Demetrios Loll M.D on 03/04/2019 at 10:22 AM  Between 7am to 6pm - Pager - [phone]  After 6pm go to [email or web address] - [facility]

## 2019-03-04 NOTE — Progress Notes (Signed)
[facility]  ROUNDING NOTE   Subjective:  Patient underwent urgent dialysis yesterday for severe hypercalcemia. Calcium now down to 12.9. She will be undergoing another dialysis session after consulting with patient's daughter. Urine output did pick up to 1 L over the preceding 24 hours. Patient had some tremors during and after dialysis yesterday.  Objective:  Vital signs in last 24 hours:  Temp:  [98 F (36.7 C)-98.6 F (37 C)] 98 F (36.7 C) (07/05 0752) Pulse Rate:  [51-97] 80 (07/05 0752) Resp:  [16-30] 19 (07/05 0752) BP: (98-196)/(52-133) 105/52 (07/05 0752) SpO2:  [94 %-100 %] 100 % (07/05 0752) Weight:  [63.8 kg-66.7 kg] 66.7 kg (07/05 0326)  Weight change: -6.507 kg Filed Weights   03/03/19 1520 03/03/19 1745 03/04/19 0326  Weight: 63.8 kg 63.8 kg 66.7 kg    Intake/Output: I/O last 3 completed shifts: In: 3433.6 [P.O.:120; I.V.:2413.6; IV Piggyback:900] Out: 1550 [Urine:1550]   Intake/Output this shift:  No intake/output data recorded.  Physical Exam: General: Obtunded  Head: Normocephalic, atraumatic. Dry oral mucosal membranes  Eyes: Anicteric  Neck: Supple, trachea midline  Lungs:  Clear to auscultation, normal effort  Heart: S1S2 no rubs  Abdomen:  Soft, nontender, bowel sounds present  Extremities: No peripheral edema.  Neurologic: Obtunded  Skin: No lesions  Access: Right femoral dialysis catheter    Basic Metabolic Panel: Recent Labs  Lab 03/02/19 1105 03/02/19 1306  03/03/19 0436 03/03/19 1628 03/03/19 1629 03/04/19 0609  NA 145  --   --  148*  --  143 138  K 2.6*  --   --  2.9*  --  2.9* 4.0  CL 99  --   --  106  --  102 103  CO2 35*  --   --  33*  --  34* 30  GLUCOSE 134*  --   --  96  --  100* 105*  BUN 43*  --   --  41*  --  22 26*  CREATININE 1.43*  --   --  1.50*  --  0.90 1.36*  CALCIUM >15.0*  --    < > >15.0*  --  12.5* 12.9*  MG  --  1.5*  --  1.9  --  1.5* 1.2*  PHOS  --   --   --  4.2 1.9* 1.9* 2.0*   < >  = values in this interval not displayed.    Liver Function Tests: Recent Labs  Lab 03/02/19 1105 03/03/19 0436 03/03/19 1629 03/04/19 0609  AST 26  --   --   --   ALT 13  --   --   --   ALKPHOS 69  --   --   --   BILITOT 1.9* 1.3*  --   --   PROT 7.0  --   --   --   ALBUMIN 3.2*  --  2.6* 2.4*   No results for input(s): LIPASE, AMYLASE in the last 168 hours. No results for input(s): AMMONIA in the last 168 hours.  CBC: Recent Labs  Lab 03/02/19 1005 03/03/19 0436  WBC 9.9 8.5  HGB 10.3* 10.3*  HCT 33.9* 34.9*  MCV 105.6* 109.4*  PLT 221 179    Cardiac Enzymes: No results for input(s): CKTOTAL, CKMB, CKMBINDEX, TROPONINI in the last 168 hours.  BNP: Invalid input(s): POCBNP  CBG: Recent Labs  Lab 03/04/19 0329 03/04/19 0414 03/04/19 0605 03/04/19 0753 03/04/19 1126  GLUCAP 102* 91 103* 79 126*  Microbiology: Results for orders placed or performed during the hospital encounter of 03/02/19  Blood Culture (routine x 2)     Status: None (Preliminary result)   Collection Time: 03/02/19 10:06 AM   Specimen: BLOOD  Result Value Ref Range Status   Specimen Description BLOOD BLOOD RIGHT WRIST  Final   Special Requests   Final    BOTTLES DRAWN AEROBIC AND ANAEROBIC Blood Culture results may not be optimal due to an inadequate volume of blood received in culture bottles   Culture   Final    NO GROWTH 2 DAYS Performed at [facility], [address] [place], [place] [address]    Report Status PENDING  Incomplete  Blood Culture (routine x 2)     Status: None (Preliminary result)   Collection Time: 03/02/19 10:19 AM   Specimen: BLOOD  Result Value Ref Range Status   Specimen Description BLOOD LEFT ANTECUBITAL  Final   Special Requests   Final    BOTTLES DRAWN AEROBIC AND ANAEROBIC Blood Culture results may not be optimal due to an inadequate volume of blood received in culture bottles   Culture   Final    NO GROWTH 2 DAYS Performed at [facility], [address] [place], [place] [address]    Report Status PENDING  Incomplete  SARS Coronavirus 2 (CEPHEID- Performed in [place] hospital lab), Hosp Order     Status: None   Collection Time: 03/02/19 10:20 AM   Specimen: Nasopharyngeal Swab  Result Value Ref Range Status   SARS Coronavirus 2 NEGATIVE NEGATIVE Final    Comment: (NOTE) If result is NEGATIVE SARS-CoV-2 target nucleic acids are NOT DETECTED. The SARS-CoV-2 RNA is generally detectable in upper and lower  respiratory specimens during the acute phase of infection. The lowest  concentration of SARS-CoV-2 viral copies this assay can detect is 250  copies / mL. A negative result does not preclude SARS-CoV-2 infection  and should not be used as the sole basis for treatment or other  patient management decisions.  A negative result may occur with  improper specimen collection / handling, submission of specimen other  than nasopharyngeal swab, presence of viral mutation(s) within the  areas targeted by this assay, and inadequate number of viral copies  (<250 copies / mL). A negative result must be combined with clinical  observations, patient history, and epidemiological information. If result is POSITIVE SARS-CoV-2 target nucleic acids are DETECTED. The SARS-CoV-2 RNA is generally detectable in upper and lower  respiratory specimens dur ing the acute phase of infection.  Positive  results are indicative of active infection with SARS-CoV-2.  Clinical  correlation with patient history and other diagnostic information is  necessary to determine patient infection status.  Positive results do  not rule out bacterial infection or co-infection with other viruses. If result is PRESUMPTIVE POSTIVE SARS-CoV-2 nucleic acids MAY BE PRESENT.   A presumptive positive result was obtained on the submitted specimen  and confirmed on repeat testing.  While 2019 novel coronavirus  (SARS-CoV-2) nucleic acids may be present in  the submitted sample  additional confirmatory testing may be necessary for epidemiological  and / or clinical management purposes  to differentiate between  SARS-CoV-2 and other Sarbecovirus currently known to infect humans.  If clinically indicated additional testing with an alternate test  methodology [phone]) is advised. The SARS-CoV-2 RNA is generally  detectable in upper and lower respiratory sp ecimens during the acute  phase of infection. The expected result  is Negative. Fact Sheet for Patients:  StrictlyIdeas.no Fact Sheet for Healthcare Providers: BankingDealers.co.za This test is not yet approved or cleared by the [place] FDA and has been authorized for detection and/or diagnosis of SARS-CoV-2 by FDA under an Emergency Use Authorization (EUA).  This EUA will remain in effect (meaning this test can be used) for the duration of the COVID-19 declaration under Section 564(b)(1) of the Act, 21 U.S.C. section 360bbb-3(b)(1), unless the authorization is terminated or revoked sooner. Performed at [facility], [address] [place], [place] [address]     Coagulation Studies: No results for input(s): LABPROT, INR in the last 72 hours.  Urinalysis: Recent Labs    03/02/19 1005  COLORURINE YELLOW*  LABSPEC 1.012  PHURINE 5.0  GLUCOSEU NEGATIVE  HGBUR NEGATIVE  BILIRUBINUR NEGATIVE  KETONESUR NEGATIVE  PROTEINUR NEGATIVE  NITRITE NEGATIVE  LEUKOCYTESUR TRACE*      Imaging: [place] Ekg Site Rite  Result Date: 03/03/2019 If Site Rite image not attached, placement could not be confirmed due to current cardiac rhythm.    Medications:   . sodium chloride Stopped (03/03/19 1429)  . sodium chloride 500 mL (03/03/19 1854)  . azithromycin 500 mg (03/04/19 1138)  . cefTRIAXone (ROCEPHIN)  IV 2 g (03/04/19 1253)  . dextrose 5 % and 0.45% NaCl 125 mL/hr at 03/04/19 0842  . sodium phosphate  Dextrose 5% IVPB 10  mmol (03/04/19 1300)   . chlorhexidine  15 mL Mouth Rinse BID  . Chlorhexidine Gluconate Cloth  6 each Topical Q0600  . cholestyramine  4 g Oral Daily  . dexamethasone  20 mg Oral Weekly  . famotidine  20 mg Oral Daily  . ferrous sulfate  325 mg Oral BID WC  . loperamide  4 mg Oral Once  . mouth rinse  15 mL Mouth Rinse q12n4p  . metoprolol succinate  25 mg Oral QHS  . multivitamin-lutein  1 capsule Oral BID  . potassium chloride  10 mEq Oral BID  . Rivaroxaban  15 mg Oral Daily  . venlafaxine XR  75 mg Oral Daily   sodium chloride, sodium chloride, acetaminophen **OR** acetaminophen, albuterol, bisacodyl, guaiFENesin-dextromethorphan, HYDROcodone-acetaminophen, morphine injection, ondansetron **OR** ondansetron (ZOFRAN) IV, senna-docusate  Assessment/ Plan:  85 y.o. female with a PMHx of atrial fibrillation, breast cancer, congestive heart failure, diabetes mellitus type 2, hypertension, multiple myeloma, who was admitted to [facility] on 03/02/2019 for evaluation of altered mental status and confusion for the past 2 days.   1.  Severe hypercalcemia with calcium greater than 15 upon presentation.  Patient administered IV bisphosphonate as well as calcitonin upon admission. 2.  Multiple myeloma with recent initiation of chemotherapy. 3.  Acute renal failure. 4.  Altered mental status likely secondary to hypercalcemia.  Plan: Patient underwent initiation of hemodialysis yesterday with significant improvement in calcium.  Initial calcium was greater than 15.  Calcium now down to 12.9.  We will plan for an additional dialysis treatment today.  However family does not want prolonged dialysis.  Therefore we will need to assess for any further need of dialysis.  We did advise the family that we would like to attempt to normalize the calcium before making any further decisions.  Appreciate oncology follow-up as well.  Reassess calcium tomorrow as well.  LOS: 2 Cassie Huffman 7/5/20202:31 PM

## 2019-03-04 NOTE — Plan of Care (Signed)
  Problem: Safety: Goal: Ability to remain free from injury will improve Outcome: Progressing   

## 2019-03-04 NOTE — Progress Notes (Signed)
[facility] Hematology/Oncology Progress Note  Date of admission: 03/02/2019  Hospital day:  03/04/2019  Chief Complaint: Cassie Huffman is a 85 y.o. female with multiple myeloma who was admitted through the emergency room with altered mental status and hypercalcemia.  Subjective: Patient remains unresponsive.  Calcium improved after initial   Social History: The patient is accompanied by her daughter today.  Contact number is [phone].  Allergies:  Allergies  Allergen Reactions  . Tape Hives    Pt states that she gets whelps that spread out from the Bandaid Pt states that she gets whelps that spread out from the Bandaid Pt states that she gets whelps that spread out from the Bandaid Pt states that she gets whelps that spread out from the Bandaid Pt states that she gets whelps that spread out from the Bandaid    Scheduled Medications: . chlorhexidine  15 mL Mouth Rinse BID  . Chlorhexidine Gluconate Cloth  6 each Topical Q0600  . cholestyramine  4 g Oral Daily  . dexamethasone  20 mg Oral Weekly  . famotidine  20 mg Oral Daily  . ferrous sulfate  325 mg Oral BID WC  . loperamide  4 mg Oral Once  . mouth rinse  15 mL Mouth Rinse q12n4p  . metoprolol succinate  25 mg Oral QHS  . multivitamin-lutein  1 capsule Oral BID  . potassium chloride  10 mEq Oral BID  . Rivaroxaban  15 mg Oral Daily  . venlafaxine XR  75 mg Oral Daily    Review of Systems: Unable to be obtained from patient.  Information provided by daughter. GENERAL:  Doing poorly. PERFORMANCE STATUS (ECOG):  3-4 Lungs: No apparent shortness of breath or cough. GI:  Abdominal discomfort.  Constipation.  No melena or hematochezia. GU:  Catheter in place. Musculoskeletal:  No apparent pain unless moved. Extremities:  Pain on movement.  No swelling. Skin:  No rashes. Neuro:  Unresponsive. Pain:  No apparent pain. Review of systems:  All other systems reviewed and found to be  negative.  Physical Exam: Blood pressure (!) 105/52, pulse 80, temperature 98 F (36.7 C), temperature source Oral, resp. rate 19, height _0  (1.499 m), weight 147 lb 1.6 oz (66.7 kg), SpO2 100 %.  GENERAL:  Elderly woman lying on the medical unit in no acute distress. MENTAL STATUS:  Not alert.  No interaction.  Responds to sternal rub and leg movement. HEAD:  Pearline Cables hair.  Normocephalic, atraumatic, face symmetric, no Cushingoid features. EYES:  Brown eyes.  Pupils equal round and reactive to light and accomodation.  No conjunctivitis or scleral icterus. ENT:  Mexia in place.  Mouth breathing.  Oropharynx clear without lesion.  Tongue normal. Mucous membranes dry.  RESPIRATORY:  Clear to auscultation without rales, wheezes or rhonchi. CARDIOVASCULAR:  Regular rate and rhythm without murmur, rub or gallop. ABDOMEN:  Soft, slightly tender on palpation.  Active bowel sounds and no hepatosplenomegaly.  No masses. SKIN:  No rashes, ulcers or lesions. EXTREMITIES: No edema, no skin discoloration or tenderness.  No palpable cords. NEUROLOGICAL: Grimaces to sternal rub and bilateral toe pinch.  Moves all 4 extremities.   Results for orders placed or performed during the hospital encounter of 03/02/19 (from the past 48 hour(s))  Glucose, capillary     Status: Abnormal   Collection Time: 03/02/19  4:31 PM  Result Value Ref Range   Glucose-Capillary 112 (H) 70 - 99 mg/dL   Comment 1 Notify RN  Comment 2 Document in Chart   Calcium     Status: Abnormal   Collection Time: 03/02/19  5:07 PM  Result Value Ref Range   Calcium >15.0 (HH) 8.9 - 10.3 mg/dL    Comment: CRITICAL RESULT CALLED TO, READ BACK BY AND VERIFIED WITH JESSICA CHRISTMAS_0 /CHC/03/02/2019 Performed at [facility], [address] [place], [place] [address]   Glucose, capillary     Status: Abnormal   Collection Time: 03/02/19  9:05 PM  Result Value Ref Range   Glucose-Capillary 116 (H) 70 - 99 mg/dL  Basic  metabolic panel     Status: Abnormal   Collection Time: 03/03/19  4:36 AM  Result Value Ref Range   Sodium 148 (H) 135 - 145 mmol/L   Potassium 2.9 (L) 3.5 - 5.1 mmol/L   Chloride 106 98 - 111 mmol/L   CO2 33 (H) 22 - 32 mmol/L   Glucose, Bld 96 70 - 99 mg/dL   BUN 41 (H) 8 - 23 mg/dL   Creatinine, Ser 1.50 (H) 0.44 - 1.00 mg/dL   Calcium >15.0 (HH) 8.9 - 10.3 mg/dL    Comment: CRITICAL RESULT CALLED TO, READ BACK BY AND VERIFIED WITH KAT MURRAY AT 0516 ON 03/03/19 RWW    GFR calc non Af Amer 30 (L) >60 mL/min   GFR calc Af Amer 35 (L) >60 mL/min   Anion gap 9 5 - 15    Comment: Performed at [facility], [address] [place], [place] [address]  CBC     Status: Abnormal   Collection Time: 03/03/19  4:36 AM  Result Value Ref Range   WBC 8.5 4.0 - 10.5 K/uL   RBC 3.19 (L) 3.87 - 5.11 MIL/uL   Hemoglobin 10.3 (L) 12.0 - 15.0 g/dL   HCT 34.9 (L) 36.0 - 46.0 %   MCV 109.4 (H) 80.0 - 100.0 fL   MCH 32.3 26.0 - 34.0 pg   MCHC 29.5 (L) 30.0 - 36.0 g/dL   RDW 20.5 (H) 11.5 - 15.5 %   Platelets 179 150 - 400 K/uL   nRBC 0.6 (H) 0.0 - 0.2 %    Comment: Performed at [facility], [address] [place], [place] [address]  Bilirubin, total     Status: Abnormal   Collection Time: 03/03/19  4:36 AM  Result Value Ref Range   Total Bilirubin 1.3 (H) 0.3 - 1.2 mg/dL    Comment: Performed at [facility], [address] [place], [place] [address]  Bilirubin, direct     Status: Abnormal   Collection Time: 03/03/19  4:36 AM  Result Value Ref Range   Bilirubin, Direct 0.5 (H) 0.0 - 0.2 mg/dL    Comment: Performed at [facility], [address] [place], [place] [address]  Magnesium     Status: None   Collection Time: 03/03/19  4:36 AM  Result Value Ref Range   Magnesium 1.9 1.7 - 2.4 mg/dL    Comment: Performed at [facility], [address] [place], [place] [address]  Lactic acid, plasma     Status: Abnormal   Collection Time:  03/03/19  4:36 AM  Result Value Ref Range   Lactic Acid, Venous 2.7 (HH) 0.5 - 1.9 mmol/L    Comment: CRITICAL RESULT CALLED TO, READ BACK BY AND VERIFIED WITH KAT MURRAY AT 0516 ON 03/03/19 RWW Performed at [facility], [address] [place], [place] [address]   Phosphorus     Status:  None   Collection Time: 03/03/19  4:36 AM  Result Value Ref Range   Phosphorus 4.2 2.5 - 4.6 mg/dL    Comment: Performed at [facility], [address] [place], [place] [address]  Glucose, capillary     Status: None   Collection Time: 03/03/19  7:45 AM  Result Value Ref Range   Glucose-Capillary 86 70 - 99 mg/dL  Glucose, capillary     Status: None   Collection Time: 03/03/19 11:33 AM  Result Value Ref Range   Glucose-Capillary 79 70 - 99 mg/dL  Phosphorus     Status: Abnormal   Collection Time: 03/03/19  4:28 PM  Result Value Ref Range   Phosphorus 1.9 (L) 2.5 - 4.6 mg/dL    Comment: Performed at [facility], [address] [place], [place] [address]  Renal function panel     Status: Abnormal   Collection Time: 03/03/19  4:29 PM  Result Value Ref Range   Sodium 143 135 - 145 mmol/L   Potassium 2.9 (L) 3.5 - 5.1 mmol/L   Chloride 102 98 - 111 mmol/L   CO2 34 (H) 22 - 32 mmol/L   Glucose, Bld 100 (H) 70 - 99 mg/dL   BUN 22 8 - 23 mg/dL   Creatinine, Ser 0.90 0.44 - 1.00 mg/dL   Calcium 12.5 (H) 8.9 - 10.3 mg/dL   Phosphorus 1.9 (L) 2.5 - 4.6 mg/dL   Albumin 2.6 (L) 3.5 - 5.0 g/dL   GFR calc non Af Amer 56 (L) >60 mL/min   GFR calc Af Amer >60 >60 mL/min   Anion gap 7 5 - 15    Comment: Performed at [facility], [address], [place], [place] [address]  Magnesium     Status: Abnormal   Collection Time: 03/03/19  4:29 PM  Result Value Ref Range   Magnesium 1.5 (L) 1.7 - 2.4 mg/dL    Comment: Performed at [facility], [address] [place], [place] [address]  TSH     Status: Abnormal   Collection Time: 03/03/19  4:29 PM  Result  Value Ref Range   TSH 0.241 (L) 0.350 - 4.500 uIU/mL    Comment: Performed by a 3rd Generation assay with a functional sensitivity of <=0.01 uIU/mL. Performed at [facility], [address] [place], [place] [address]   Glucose, capillary     Status: Abnormal   Collection Time: 03/03/19  9:15 PM  Result Value Ref Range   Glucose-Capillary 49 (L) 70 - 99 mg/dL   Comment 1 Notify RN    Comment 2 Document in Chart   Glucose, capillary     Status: Abnormal   Collection Time: 03/03/19  9:53 PM  Result Value Ref Range   Glucose-Capillary 119 (H) 70 - 99 mg/dL  Glucose, capillary     Status: Abnormal   Collection Time: 03/03/19 10:16 PM  Result Value Ref Range   Glucose-Capillary 103 (H) 70 - 99 mg/dL  Glucose, capillary     Status: None   Collection Time: 03/03/19 10:38 PM  Result Value Ref Range   Glucose-Capillary 94 70 - 99 mg/dL  Glucose, capillary     Status: None   Collection Time: 03/03/19 11:06 PM  Result Value Ref Range   Glucose-Capillary 88 70 - 99 mg/dL   Comment 1 Notify RN    Comment 2 Document in Chart   Glucose, capillary     Status: Abnormal   Collection Time: 03/04/19 12:19  AM  Result Value Ref Range   Glucose-Capillary 124 (H) 70 - 99 mg/dL  Glucose, capillary     Status: Abnormal   Collection Time: 03/04/19  1:05 AM  Result Value Ref Range   Glucose-Capillary 114 (H) 70 - 99 mg/dL   Comment 1 Notify RN    Comment 2 Document in Chart   Glucose, capillary     Status: Abnormal   Collection Time: 03/04/19  2:22 AM  Result Value Ref Range   Glucose-Capillary 125 (H) 70 - 99 mg/dL  Glucose, capillary     Status: Abnormal   Collection Time: 03/04/19  3:29 AM  Result Value Ref Range   Glucose-Capillary 102 (H) 70 - 99 mg/dL  Glucose, capillary     Status: None   Collection Time: 03/04/19  4:14 AM  Result Value Ref Range   Glucose-Capillary 91 70 - 99 mg/dL  Glucose, capillary     Status: Abnormal   Collection Time: 03/04/19  6:05 AM  Result Value  Ref Range   Glucose-Capillary 103 (H) 70 - 99 mg/dL  Lactic acid, plasma     Status: Abnormal   Collection Time: 03/04/19  6:08 AM  Result Value Ref Range   Lactic Acid, Venous 2.1 (HH) 0.5 - 1.9 mmol/L    Comment: CRITICAL RESULT CALLED TO, READ BACK BY AND VERIFIED WITH MARICAR KIMREY _0  03/04/19 MJU Performed at [facility], [address] [place], [place] [address]   Renal function panel     Status: Abnormal   Collection Time: 03/04/19  6:09 AM  Result Value Ref Range   Sodium 138 135 - 145 mmol/L   Potassium 4.0 3.5 - 5.1 mmol/L   Chloride 103 98 - 111 mmol/L   CO2 30 22 - 32 mmol/L   Glucose, Bld 105 (H) 70 - 99 mg/dL   BUN 26 (H) 8 - 23 mg/dL   Creatinine, Ser 1.36 (H) 0.44 - 1.00 mg/dL   Calcium 12.9 (H) 8.9 - 10.3 mg/dL   Phosphorus 2.0 (L) 2.5 - 4.6 mg/dL   Albumin 2.4 (L) 3.5 - 5.0 g/dL   GFR calc non Af Amer 34 (L) >60 mL/min   GFR calc Af Amer 40 (L) >60 mL/min   Anion gap 5 5 - 15    Comment: Performed at [facility], [address] [place], [place] [address]  Magnesium     Status: Abnormal   Collection Time: 03/04/19  6:09 AM  Result Value Ref Range   Magnesium 1.2 (L) 1.7 - 2.4 mg/dL    Comment: Performed at [facility] [address] [place], [place] [address]  Glucose, capillary     Status: None   Collection Time: 03/04/19  7:53 AM  Result Value Ref Range   Glucose-Capillary 79 70 - 99 mg/dL   Comment 1 Notify RN    Comment 2 Document in Chart   Glucose, capillary     Status: Abnormal   Collection Time: 03/04/19 11:26 AM  Result Value Ref Range   Glucose-Capillary 126 (H) 70 - 99 mg/dL   Comment 1 Notify RN    Comment 2 Document in Chart    [place] Ekg Site Rite  Result Date: 03/03/2019 If Site Rite image not attached, placement could not be confirmed due to current cardiac rhythm.   Assessment:  Cassie Huffman is a 85 y.o. female with with stage III multiple myeloma who was admitted with hypercalcemia.  She was  initially treated with weekly Decadron.  She has been on  Decadron and Revlimid since 02/23/2019.    She has hypercalcemia.  She has received IVF, Zometa, and calcitonin.  She underwent dialysis x 1 yesterday.  Symptomatically, she is responsive to pain.  Calcium is 12.9 (corrected 14.26).  Plan:   1.   Multiple myeloma             Patient recently diagnosed with multiple myeloma.             She has multiple lytic lesions on bone survey.             Calcium was normal prior to initiation of treatment.             Anticipate monthly Zometa or Xgeva after discharge.             Once patient back to baseline, anticipate reinitiation of oral medications                         Decadron 20 mg po q week.                         Revlimid day 1-21 every 28 days.                                       Dose adjusted per renal function.                                       Approximately 30% removed with dialysis. 2.   Hypercalcemia             Etiology likely related to multiple myeloma.             However, hypercalemia started after initiation of treatment.              PTH, PTH-rp, 1,25 dihydroxy D, 25 hydroxy D are pending.   TSH is 0.241 (low).  Check free T4.             Patient has received IVF, Zometa, and calcitonin without improvement.             Patient is s/p dialysis x 1 yesterday with improvement in calcium from > 15 to 12.9 (corrected 14.26).             Anticipate further dialysis to decrease calcium.   Patient's daughter unsure about additional dialysis and plans to discuss with her siblings.  Anticipate mentation will improve with improvement in calcium. 3.   Hematology  Patient on chronic Xarelto 10 mg a day.  Creatinine clearance 22.8 ml/min.  No bleeding.  Discuss with Dr Bridgett Larsson. 4.   Code status             Patient's daughter notes that medical power of attorney is shared by her and 2 siblings.   They will be discussing whether to proceed with further dialysis.  Code  status is DNR.   Lequita Asal, MD  03/04/2019, 1:27 PM

## 2019-03-04 NOTE — Progress Notes (Signed)
Lab called and reported a lactic acid of 2.1. Page prime. Will continue to monitor.

## 2019-03-04 NOTE — Progress Notes (Signed)
Patient CBG was 61. Notified MD. New orders were placed and rechecked CBG it is up to 70. Nurse will continue to monitor.

## 2019-03-04 NOTE — Progress Notes (Addendum)
Pt D5 0.45% was discontinue. Pt blood sugar at 0329 was at 103. Recheck her blood sugar at 0418 and it was at 91. [place] prime and talked to Levada Dy and order to put back the pt on D5 0.45% normal saline at 125 ml/hr and discontinue 0.45% normal saline. Will continue to monitor.  Update 0605: Pt blood sugar at 103. Will continue to monitor.

## 2019-03-04 NOTE — Progress Notes (Signed)
Spoke to [place], Therapist, sports concerning order for PICC. The patient has three PIVs documented, in addition to a temporary HD catheter. She was informed that the patient had sufficient access for prescribed medications. We also recommend IR to place any central lines, as patient has bilateral breast surgery in 2000. This author doesn't know if lymph nodes were involved and not able to find documentation in chart stating otherwise. Mendel Ryder, RN was told that the order would be canceled. She was instructed to replace order if still needed.

## 2019-03-04 NOTE — Progress Notes (Addendum)
HD Tx End  Hypotensive episodes throughout tx requiring +650 mL in total fluid normal saline fluid bolus, intermittently. Responded well to fluid bolus, yet unable to maintain BP after bolus. Lowest BP during tx 78/48. Ended tx stable at 104/52.  No tremors / jerking today.  Eyes opened for first time as I was taking off the patient's BP cuff to return to room.  Renal Panel drawn post tx.   Catheter dressing changed.     03/04/19 1630  Hand-Off documentation  Report given to (Full Name) Ria Comment RN 2A  Report received from (Full Name) Trellis Paganini RN  Vital Signs  Temp 98 F (36.7 C)  Temp Source Axillary  Pulse Rate 67  Resp 17  BP (!) 104/52  Oxygen Therapy  SpO2 99 %  O2 Device Nasal Cannula  O2 Flow Rate (L/min) 3 L/min  Pain Assessment  Pain Scale CPOT  Critical Care Pain Observation Tool (CPOT)  Facial Expression 0  Body Movements 0  Muscle Tension 0  Compliance with ventilator (intubated pts.) N/A  Vocalization (extubated pts.) 0  CPOT Total 0  Dialysis Weight  Weight 70.3 kg  Type of Weight Post-Dialysis  During Hemodialysis Assessment  Blood Flow Rate (mL/min) 250 mL/min  Arterial Pressure (mmHg) -140 mmHg  Venous Pressure (mmHg) 120 mmHg  Transmembrane Pressure (mmHg) 50 mmHg  Ultrafiltration Rate (mL/min) 0 mL/min  Dialysate Flow Rate (mL/min) 500 ml/min  Conductivity: Machine  13.9  HD Safety Checks Performed Yes  Dialysis Fluid Bolus Normal Saline  Bolus Amount (mL) 250 mL  Intra-Hemodialysis Comments Tx completed  Post-Hemodialysis Assessment  Rinseback Volume (mL) 250 mL  Dialyzer Clearance Heavily streaked  Duration of HD Treatment -hour(s) 2.5 hour(s)  Hemodialysis Intake (mL) 900 mL  UF Total -Machine (mL) 250 mL  Net UF (mL) -650 mL  Hemodialysis Catheter Right Femoral vein Triple lumen Temporary (Non-Tunneled)  Placement Date/Time: 03/03/19 1357   Time Out: Correct patient;Correct site;Correct procedure  Maximum sterile barrier  precautions: Hand hygiene;Cap;Sterile gown;Mask;Large sterile sheet;Sterile gloves  Site Prep: Chlorhexidine (preferred);Skin Prep C...  Site Condition No complications  Blue Lumen Status Flushed;Heparin locked;Capped (Central line)  Red Lumen Status Flushed;Capped (Central line);Heparin locked  Catheter fill solution Heparin 1000 units/ml  Dressing Type Biopatch;Occlusive  Dressing Status Dressing changed;Antimicrobial disc changed;Clean;Dry;Intact  Interventions New dressing;Dressing changed  Drainage Description None  Dressing Change Due 03/11/19  Post treatment catheter status Capped and Clamped

## 2019-03-04 NOTE — Consult Note (Signed)
[place] for Electrolyte Monitoring and Replacement   Recent Labs: Potassium (mmol/L)  Date Value  03/04/2019 4.0  09/13/2014 3.6   Magnesium (mg/dL)  Date Value  03/04/2019 1.2 (L)   Calcium (mg/dL)  Date Value  03/04/2019 12.9 (H)   Calcium, Total (mg/dL)  Date Value  09/13/2014 8.7   Albumin (g/dL)  Date Value  03/04/2019 2.4 (L)  09/13/2014 3.5   Phosphorus (mg/dL)  Date Value  03/04/2019 2.0 (L)   Sodium (mmol/L)  Date Value  03/04/2019 138  09/13/2014 136     Assessment: Pharmacy has been consulted to monitor and replenish electrolytes in 85yo patient.  Hospitalist treated the patient's hypercalcemia of malignancy (patient has MM) w/ pamidronate 90 mg IV x 1 over 4 hour infusion. Given pamidronate can cause hypokalemia, hypomagnesemia, hypophosphatemia and given patient was already hypokalemic she was administered KCI 10 mEq IV x 5 and Mag 2g IV x 1 PRIOR to administration of pamidronate. A follow-up potassium level later in the day was also low and she received an additional 60 mEq IV KCl  Goal of Therapy:  Electrolytes WNL  Plan:   Patient is receiving D51/2NS at 125 mL/hr  Hypokalemia is now corrected  hypercalcemia being managed by oncology  Will give 4 grams IV magnesium sulfate  Will give 36mmol sodium phosphate  Will check levels with AM labs and supplement as needed.  Dallie Piles ,PharmD Clinical Pharmacist 03/04/2019 7:30 AM

## 2019-03-04 NOTE — Progress Notes (Signed)
HD Tx Start   03/04/19 1345  Hand-Off documentation  Report given to (Full Name) Trellis Paganini RN  Report received from (Full Name) Ria Comment RN 2A  Vital Signs  Temp 97.8 F (36.6 C)  Temp Source Axillary  Pulse Rate 62  Resp 16  BP 97/60  Oxygen Therapy  SpO2 100 %  Pain Assessment  Pain Scale CPOT  Critical Care Pain Observation Tool (CPOT)  Facial Expression 0  Body Movements 0  Muscle Tension 0  Compliance with ventilator (intubated pts.) N/A  Vocalization (extubated pts.) 0  CPOT Total 0  Dialysis Weight  Weight 66.7 kg  Type of Weight Pre-Dialysis  Time-Out for Hemodialysis  What Procedure? Hemodialysis  Pt Identifiers(min of two) First/Last Name;MRN/Account#  Correct Site? Yes  Correct Side? Yes  Correct Procedure? Yes  Consents Verified? Yes  Rad Studies Available? N/A  Safety Precautions Reviewed? Yes  Engineer, civil (consulting) Number 3  Station Number 1  UF/Alarm Test Passed  Conductivity: Meter 14  Conductivity: Machine  14  pH 7.2  Reverse Osmosis Main  Normal Saline Lot Number [id number]  Dialyzer Lot Number [id number]  Disposable Set Lot Number [id number]  Machine Temperature 98.6 F (37 C)  Musician and Audible Yes  Blood Lines Intact and Secured Yes  Pre Treatment Patient Checks  Vascular access used during treatment Catheter  HD catheter dressing before treatment WDL  Hepatitis B Surface Antigen Results Pending  Hemodialysis Consent Verified Yes  Hemodialysis Standing Orders Initiated Yes  ECG (Telemetry) Monitor On Yes  Prime Ordered Normal Saline  Length of  DialysisTreatment -hour(s) 2.5 Hour(s)  Dialysis Treatment Comments Na 140  Dialyzer Elisio 17H NR  Dialysate 3K;2.5 Ca  Dialysate Flow Ordered 500  Blood Flow Rate Ordered 250 mL/min  Ultrafiltration Goal 0 Liters  Pre Treatment Labs Other (Comment) (renal POST tx)  Dialysis Blood Pressure Support Ordered Normal Saline  During Hemodialysis Assessment  Blood Flow Rate (mL/min)  250 mL/min  Arterial Pressure (mmHg) -90 mmHg  Venous Pressure (mmHg) 100 mmHg  Transmembrane Pressure (mmHg) 50 mmHg  Ultrafiltration Rate (mL/min) 0 mL/min  Dialysate Flow Rate (mL/min) 500 ml/min  Conductivity: Machine  13.8  HD Safety Checks Performed Yes  Dialysis Fluid Bolus Normal Saline  Bolus Amount (mL) 250 mL  Intra-Hemodialysis Comments Tx initiated  Education / Care Plan  Dialysis Education Provided No (Comment)  Documented Education in Care Plan  (pt not interactive/lethargic)  Hemodialysis Catheter Right Femoral vein Triple lumen Temporary (Non-Tunneled)  Placement Date/Time: 03/03/19 1357   Time Out: Correct patient;Correct site;Correct procedure  Maximum sterile barrier precautions: Hand hygiene;Cap;Sterile gown;Mask;Large sterile sheet;Sterile gloves  Site Prep: Chlorhexidine (preferred);Skin Prep C...  Site Condition No complications  Blue Lumen Status Infusing  Red Lumen Status Infusing  Dressing Type Gauze/Drain sponge  Dressing Status Clean;Dry;Intact  Drainage Description None

## 2019-03-05 ENCOUNTER — Encounter: Payer: Self-pay | Admitting: Surgery

## 2019-03-05 DIAGNOSIS — Z515 Encounter for palliative care: Secondary | ICD-10-CM

## 2019-03-05 DIAGNOSIS — Z7189 Other specified counseling: Secondary | ICD-10-CM

## 2019-03-05 DIAGNOSIS — R4182 Altered mental status, unspecified: Secondary | ICD-10-CM

## 2019-03-05 LAB — RENAL FUNCTION PANEL
Albumin: 2.2 g/dL — ABNORMAL LOW (ref 3.5–5.0)
Anion gap: 4 — ABNORMAL LOW (ref 5–15)
BUN: 17 mg/dL (ref 8–23)
CO2: 32 mmol/L (ref 22–32)
Calcium: 10.4 mg/dL — ABNORMAL HIGH (ref 8.9–10.3)
Chloride: 101 mmol/L (ref 98–111)
Creatinine, Ser: 1.08 mg/dL — ABNORMAL HIGH (ref 0.44–1.00)
GFR calc Af Amer: 52 mL/min — ABNORMAL LOW (ref >60)
GFR calc non Af Amer: 45 mL/min — ABNORMAL LOW (ref >60)
Glucose, Bld: 124 mg/dL — ABNORMAL HIGH (ref 70–99)
Phosphorus: 2.4 mg/dL — ABNORMAL LOW (ref 2.5–4.6)
Potassium: 3.7 mmol/L (ref 3.5–5.1)
Sodium: 137 mmol/L (ref 135–145)

## 2019-03-05 LAB — HEPATITIS PANEL, ACUTE
HCV Ab: 0.2 s/co ratio (ref 0.0–0.9)
Hep A IgM: NEGATIVE
Hep B C IgM: NEGATIVE
Hepatitis B Surface Ag: NEGATIVE

## 2019-03-05 LAB — PARATHYROID HORMONE, INTACT (NO CA): PTH: 11 pg/mL — ABNORMAL LOW (ref 15–65)

## 2019-03-05 LAB — GLUCOSE, CAPILLARY
Glucose-Capillary: 112 mg/dL — ABNORMAL HIGH (ref 70–99)
Glucose-Capillary: 130 mg/dL — ABNORMAL HIGH (ref 70–99)
Glucose-Capillary: 70 mg/dL (ref 70–99)
Glucose-Capillary: 74 mg/dL (ref 70–99)
Glucose-Capillary: 83 mg/dL (ref 70–99)
Glucose-Capillary: 88 mg/dL (ref 70–99)

## 2019-03-05 LAB — PTH, INTACT AND CALCIUM
Calcium, Total (PTH): 12.7 mg/dL — ABNORMAL HIGH (ref 8.7–10.3)
PTH: 14 pg/mL — ABNORMAL LOW (ref 15–65)

## 2019-03-05 LAB — MAGNESIUM: Magnesium: 1.6 mg/dL — ABNORMAL LOW (ref 1.7–2.4)

## 2019-03-05 MED ORDER — FAMOTIDINE 20 MG PO TABS: 10.0000 mg | ORAL_TABLET | Freq: Every day | ORAL | Status: DC

## 2019-03-05 MED ORDER — POTASSIUM PHOSPHATES 15 MMOLE/5ML IV SOLN
10.0000 mmol | Freq: Once | INTRAVENOUS | Status: AC
  Administered 2019-03-05: 10 mmol via INTRAVENOUS
  Filled 2019-03-05: qty 3.33

## 2019-03-05 MED ORDER — MORPHINE SULFATE (PF) 2 MG/ML IV SOLN
2.0000 mg | INTRAVENOUS | Status: DC | PRN
  Administered 2019-03-05 – 2019-03-06 (×2): 2 mg via INTRAVENOUS
  Filled 2019-03-05 (×2): qty 1

## 2019-03-05 MED ORDER — MAGNESIUM SULFATE 2 GM/50ML IV SOLN: 2.0000 g | Freq: Once | INTRAVENOUS | Status: DC

## 2019-03-05 MED ORDER — MAGNESIUM SULFATE 2 GM/50ML IV SOLN
2.0000 g | Freq: Once | INTRAVENOUS | Status: AC
  Administered 2019-03-05: 2 g via INTRAVENOUS
  Filled 2019-03-05: qty 50

## 2019-03-05 NOTE — TOC Progression Note (Addendum)
Transition of Care [facility]) - Progression Note    Patient Details  Name: Chenoa Luddy MRN: [id number] Date of Birth: 08/31/1926  [facility] CM/SW Contact  Ross Ludwig,  Phone Number: 03/05/2019, 4:48 PM  Clinical Narrative:     Patient's family is now requesting the hospice home.  CSW provided choice and they chose [facility], however there is not a bed available today.  CSW was informed by patient's daughter [name] [phone] they have decided to pursue the hospice home.  CSW contacted [place] at [facility], and she said there are not any beds today, but she will check to see if anything is available tomorrow, and let this CSW know.  Patient's daughter stated that she has discussed with her family members and they have all agreed to pursue hospice facility due to patient deteriorating and not improving like the family was hoping.   Expected Discharge Plan: [place]                                                                                                    Barriers to Discharge: Continued Medical Work up, Other (comment)(No bed availability for hospice facility.)  Expected Discharge Plan and Services Expected Discharge Plan: [place] In-house Referral: Clinical Social Work   Post Acute Care Choice: Hospice Living arrangements for the past 2 months: Single Family Home                 DME Arranged: N/A DME Agency: NA     Representative spoke with at DME Agency: na   [place]: NA         Social Determinants of Health ([place]) Interventions    Readmission Risk Interventions No flowsheet data found.

## 2019-03-05 NOTE — Progress Notes (Signed)
Patient ID: Cassie Huffman, female   DOB: 02-Sep-1926, 85 y.o.   MRN: [id number]  [employer] PROGRESS NOTE  Cassie Huffman MEQ:[id number] DOB: [birth year] DOA: 03/02/2019 PCP: Kirk Ruths, MD  HPI/Subjective: Patient mumbled once I gave her sternal rub.  Patient has not regained her mental status even though calcium came down a little bit.  Daughter at the bedside interested in hospice home.  Objective: Vitals:   03/05/19 0729 03/05/19 0821  BP: 114/86   Pulse: 68   Resp:  20  Temp: 99.1 F (37.3 C)   SpO2: 91%     Intake/Output Summary (Last 24 hours) at 03/05/2019 1630 Last data filed at 03/05/2019 0435 Gross per 24 hour  Intake 3171.75 ml  Output 200 ml  Net 2971.75 ml   Filed Weights   03/04/19 0326 03/04/19 1345 03/04/19 1630  Weight: 66.7 kg 66.7 kg 70.3 kg    ROS: Review of Systems  Unable to perform ROS: Acuity of condition   Exam: Physical Exam  Constitutional: She appears lethargic.  HENT:  Nose: No mucosal edema.  Mouth/Throat: No oropharyngeal exudate or posterior oropharyngeal edema.  Eyes: Pupils are equal, round, and reactive to light. Conjunctivae, EOM and lids are normal.  Neck: No JVD present. Carotid bruit is not present. No edema present. No thyroid mass and no thyromegaly present.  Cardiovascular: S1 normal and S2 normal. Exam reveals no gallop.  No murmur heard. Pulses:      Dorsalis pedis pulses are 2+ on the right side and 2+ on the left side.  Respiratory: No respiratory distress. She has no wheezes. She has no rhonchi. She has no rales.  GI: Soft. Bowel sounds are normal. There is no abdominal tenderness.  Musculoskeletal:     Right ankle: She exhibits swelling.     Left ankle: She exhibits swelling.  Lymphadenopathy:    She has no cervical adenopathy.  Neurological: She appears lethargic.  Babinski negative bilaterally.  Skin: Skin is warm. No rash noted. Nails show no clubbing.  Psychiatric:  Mumbled with sternal rub.       Data Reviewed: Basic Metabolic Panel: Recent Labs  Lab 03/02/19 1306  03/03/19 0436 03/03/19 1628 03/03/19 1629 03/04/19 0609 03/04/19 1652 03/05/19 0554  NA  --   --  148*  --  143 138 138 137  K  --   --  2.9*  --  2.9* 4.0 3.5 3.7  CL  --   --  106  --  102 103 102 101  CO2  --   --  33*  --  34* 30 33* 32  GLUCOSE  --   --  96  --  100* 105* 116* 124*  BUN  --   --  41*  --  22 26* 11 17  CREATININE  --   --  1.50*  --  0.90 1.36* 0.61 1.08*  CALCIUM  --    < > >15.0*  --  12.5* 12.9* 9.5 10.4*  MG 1.5*  --  1.9  --  1.5* 1.2*  --  1.6*  PHOS  --    < > 4.2 1.9* 1.9* 2.0* 1.5* 2.4*   < > = values in this interval not displayed.   Liver Function Tests: Recent Labs  Lab 03/02/19 1105 03/03/19 0436 03/03/19 1629 03/04/19 0609 03/04/19 1652 03/05/19 0554  AST 26  --   --   --   --   --   ALT 13  --   --   --   --   --  ALKPHOS 69  --   --   --   --   --   BILITOT 1.9* 1.3*  --   --   --   --   PROT 7.0  --   --   --   --   --   ALBUMIN 3.2*  --  2.6* 2.4* 2.2* 2.2*   CBC: Recent Labs  Lab 03/02/19 1005 03/03/19 0436  WBC 9.9 8.5  HGB 10.3* 10.3*  HCT 33.9* 34.9*  MCV 105.6* 109.4*  PLT 221 179    CBG: Recent Labs  Lab 03/04/19 2106 03/04/19 2358 03/05/19 0222 03/05/19 0730 03/05/19 1143  GLUCAP 61* 70 83 88 112*    Recent Results (from the past 240 hour(s))  Blood Culture (routine x 2)     Status: None (Preliminary result)   Collection Time: 03/02/19 10:06 AM   Specimen: BLOOD  Result Value Ref Range Status   Specimen Description BLOOD BLOOD RIGHT WRIST  Final   Special Requests   Final    BOTTLES DRAWN AEROBIC AND ANAEROBIC Blood Culture results may not be optimal due to an inadequate volume of blood received in culture bottles   Culture   Final    NO GROWTH 3 DAYS Performed at [facility], [address], [place], [place] [address]    Report Status PENDING  Incomplete  Blood Culture (routine x 2)     Status: None  (Preliminary result)   Collection Time: 03/02/19 10:19 AM   Specimen: BLOOD  Result Value Ref Range Status   Specimen Description BLOOD LEFT ANTECUBITAL  Final   Special Requests   Final    BOTTLES DRAWN AEROBIC AND ANAEROBIC Blood Culture results may not be optimal due to an inadequate volume of blood received in culture bottles   Culture   Final    NO GROWTH 3 DAYS Performed at [facility], [address] [place], [place] [address]    Report Status PENDING  Incomplete  SARS Coronavirus 2 (CEPHEID- Performed in [place] hospital lab), Hosp Order     Status: None   Collection Time: 03/02/19 10:20 AM   Specimen: Nasopharyngeal Swab  Result Value Ref Range Status   SARS Coronavirus 2 NEGATIVE NEGATIVE Final    Comment: (NOTE) If result is NEGATIVE SARS-CoV-2 target nucleic acids are NOT DETECTED. The SARS-CoV-2 RNA is generally detectable in upper and lower  respiratory specimens during the acute phase of infection. The lowest  concentration of SARS-CoV-2 viral copies this assay can detect is 250  copies / mL. A negative result does not preclude SARS-CoV-2 infection  and should not be used as the sole basis for treatment or other  patient management decisions.  A negative result may occur with  improper specimen collection / handling, submission of specimen other  than nasopharyngeal swab, presence of viral mutation(s) within the  areas targeted by this assay, and inadequate number of viral copies  (<250 copies / mL). A negative result must be combined with clinical  observations, patient history, and epidemiological information. If result is POSITIVE SARS-CoV-2 target nucleic acids are DETECTED. The SARS-CoV-2 RNA is generally detectable in upper and lower  respiratory specimens dur ing the acute phase of infection.  Positive  results are indicative of active infection with SARS-CoV-2.  Clinical  correlation with patient history and other diagnostic information is   necessary to determine patient infection status.  Positive results do  not rule out bacterial infection or co-infection with other viruses.  If result is PRESUMPTIVE POSTIVE SARS-CoV-2 nucleic acids MAY BE PRESENT.   A presumptive positive result was obtained on the submitted specimen  and confirmed on repeat testing.  While 2019 novel coronavirus  (SARS-CoV-2) nucleic acids may be present in the submitted sample  additional confirmatory testing may be necessary for epidemiological  and / or clinical management purposes  to differentiate between  SARS-CoV-2 and other Sarbecovirus currently known to infect humans.  If clinically indicated additional testing with an alternate test  methodology [phone]) is advised. The SARS-CoV-2 RNA is generally  detectable in upper and lower respiratory sp ecimens during the acute  phase of infection. The expected result is Negative. Fact Sheet for Patients:  StrictlyIdeas.no Fact Sheet for Healthcare Providers: BankingDealers.co.za This test is not yet approved or cleared by the [place] FDA and has been authorized for detection and/or diagnosis of SARS-CoV-2 by FDA under an Emergency Use Authorization (EUA).  This EUA will remain in effect (meaning this test can be used) for the duration of the COVID-19 declaration under Section 564(b)(1) of the Act, 21 U.S.C. section 360bbb-3(b)(1), unless the authorization is terminated or revoked sooner. Performed at [facility], [address] [place], [place] [address]      Scheduled Meds: . chlorhexidine  15 mL Mouth Rinse BID  . Chlorhexidine Gluconate Cloth  6 each Topical Q0600  . cholestyramine  4 g Oral Daily  . dexamethasone  20 mg Oral Weekly  . enoxaparin (LOVENOX) injection  40 mg Subcutaneous Q24H  . [START ON 03/06/2019] famotidine  10 mg Oral Daily  . ferrous sulfate  325 mg Oral BID WC  . loperamide  4 mg Oral Once  . mouth  rinse  15 mL Mouth Rinse q12n4p  . multivitamin-lutein  1 capsule Oral BID  . venlafaxine XR  75 mg Oral Daily   Continuous Infusions: . sodium chloride Stopped (03/03/19 1429)  . sodium chloride 500 mL (03/05/19 0829)  . azithromycin Stopped (03/05/19 1420)  . cefTRIAXone (ROCEPHIN)  IV 2 g (03/05/19 1450)  . dextrose 20 mL/hr at 03/05/19 0435  . potassium PHOSPHATE IVPB (in mmol) 10 mmol (03/05/19 1229)    Assessment/Plan:   1. Acute metabolic encephalopathy.  Patient has not regained her mental status despite getting a calcium lower.  Patient's daughter refused any further imaging of the brain because it will not likely change the outcome.  Hospice home hopefully for tomorrow.  Currently no beds today. 2. Severe hypercalcemia.  Patient was dialyzed and given Zometa and IV fluids.  Calcium up a little bit today to 10.4. 3. Severe hypokalemia, hypomagnesemia.  Replace electrolytes. 4. Acute renal failure secondary to dehydration 5. Acute respiratory failure secondary to hypoxia due to pneumonia.  On Rocephin and Zithromax 6. Multiple myeloma and history of breast cancer. 7. Chronic atrial fibrillation holding Xarelto 8. Type 2 diabetes holding metformin 9. History of hypertension holding oral medications  Code Status:     Code Status Orders  (From admission, onward)         Start     Ordered   03/02/19 1346  Do not attempt resuscitation (DNR)  Continuous    Question Answer Comment  In the event of cardiac or respiratory ARREST Do not call a "code blue"   In the event of cardiac or respiratory ARREST Do not perform Intubation, CPR, defibrillation or ACLS   In the event of cardiac or respiratory ARREST Use medication by any route, position, wound care, and other measures  to relive pain and suffering. May use oxygen, suction and manual treatment of airway obstruction as needed for comfort.      03/02/19 1345        Code Status History    This patient has a current code  status but no historical code status.   Advance Care Planning Activity    Advance Directive Documentation     Most Recent Value  Type of Advance Directive  Healthcare Power of Attorney  Pre-existing out of facility DNR order (yellow form or pink MOST form)  -  "MOST" Form in Place?  -     Family Communication: Spoke with daughter at the bedside Disposition Plan: Hospice home when bed available  Time spent: 32 minutes  [place]

## 2019-03-05 NOTE — Consult Note (Signed)
Consultation Note Date: 03/05/2019   Patient Name: Cassie Huffman  DOB: 01-24-1927  MRN: [id number]  Age / Sex: 85 y.o., female  PCP: Kirk Ruths, MD Referring Physician: Loletha Grayer, MD  Reason for Consultation: Establishing goals of care  HPI/Patient Profile: 85 y.o. female  with past medical history of A. fib, breast cancer, multiple myeloma, CHF, hypertension, diabetes and umbilical hernia admitted on 03/02/2019 with hypercalcemia.   Clinical Assessment and Goals of Care: I have reviewed medical records including EPIC notes, labs and imaging, assessed the patient and then met at the bedside with daughter [name] to discuss diagnosis prognosis, GOC, EOL wishes, disposition and options.  I introduced Palliative Medicine as specialized medical care for people living with serious illness. It focuses on providing relief from the symptoms and stress of a serious illness. The goal is to improve quality of life for both the patient and the family.  We discussed a brief life review of the patient. 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  [name] shares that she said, "It's in God's hands" when she was diagnosed with cancer.   As far as functional and nutritional status, [name] shares that her mother has had a decline over the last year.   We discussed her current illness and what it means in the larger context of her on-going co-morbidities.  Natural disease trajectory and expectations at EOL were discussed.  [name] shares he concern that medical treatments have corrected some labs, but are not changing outcomes for Mrs. Truman.   I attempted to elicit values and goals of care important to the patient. [name] shares that quality of life is important to Mrs. Fergeson.   Advanced directives, concepts specific to code status and rehospitalization were considered and discussed.  [name] endorses "Allow a natural death".    The  difference between aggressive medical intervention and comfort care was considered in light of the patient's goals of care.  I share a diagram of the chornic illness pathway.  Hospice and Palliative Care services outpatient were explained and offered. Daughter [name] shares that they will choose residential hospice in [place] if no improvement by 7/7. We talk about no aggressive medical treatments at residential hospice, "let nature take it's course".  [name] tells me that they are familiar with hospice care.   Questions and concerns were addressed.   The family was encouraged to call with questions or concerns.   SW updated.    HCPOA    HCPOA -  Daughter [name] at bedside. [place] in [place], [place] in [place]. Children make choices as a team.    SUMMARY OF RECOMMENDATIONS   If no improvements in physical condition, irrelevant of labs, family wants comfort, residential hospice.    Code Status/Advance Care Planning:  DNR  Symptom Management:   Adjusted morphine range.   Palliative Prophylaxis:   Frequent Pain Assessment and Turn Reposition  Additional Recommendations (Limitations, Scope, Preferences):  treat the treatable, considering residential hospice if no change.   Psycho-social/Spiritual:   Desire for further Chaplaincy support:no  Additional Recommendations: Caregiving  Support/Resources and Education on Hospice  Prognosis:   < 2 weeks or less expected when family chooses to focus on comfort.   Discharge Planning: residential hospice in [place] if no improvement.       Primary Diagnoses: Present on Admission: . Hypercalcemia   I have reviewed the medical record, interviewed the patient and family, and examined the patient. The following aspects are pertinent.  Past Medical History:  Diagnosis Date  . A-fib ([place])   . Breast cancer ([place])   . Cancer ([place])   . CHF (congestive heart failure) ([place])   . Diabetes mellitus without complication ([place])   . Hypertension    . Multiple myeloma ([place])   . Umbilical hernia    Social History   Socioeconomic History  . Marital status: Single    Spouse name: Not on file  . Number of children: Not on file  . Years of education: Not on file  . Highest education level: Not on file  Occupational History  . Not on file  Social Needs  . Financial resource strain: Not on file  . Food insecurity    Worry: Not on file    Inability: Not on file  . Transportation needs    Medical: Not on file    Non-medical: Not on file  Tobacco Use  . Smoking status: Never Smoker  . Smokeless tobacco: Never Used  Substance and Sexual Activity  . Alcohol use: No  . Drug use: Never  . Sexual activity: Not on file  Lifestyle  . Physical activity    Days per week: Not on file    Minutes per session: Not on file  . Stress: Not on file  Relationships  . Social Herbalist on phone: Not on file    Gets together: Not on file    Attends religious service: Not on file    Active member of club or organization: Not on file    Attends meetings of clubs or organizations: Not on file    Relationship status: Not on file  Other Topics Concern  . Not on file  Social History Narrative  . Not on file   Family History  Problem Relation Age of Onset  . Stroke Father   . Breast cancer Sister    Scheduled Meds: . chlorhexidine  15 mL Mouth Rinse BID  . Chlorhexidine Gluconate Cloth  6 each Topical Q0600  . cholestyramine  4 g Oral Daily  . dexamethasone  20 mg Oral Weekly  . enoxaparin (LOVENOX) injection  40 mg Subcutaneous Q24H  . famotidine  20 mg Oral Daily  . ferrous sulfate  325 mg Oral BID WC  . loperamide  4 mg Oral Once  . mouth rinse  15 mL Mouth Rinse q12n4p  . metoprolol succinate  25 mg Oral QHS  . multivitamin-lutein  1 capsule Oral BID  . venlafaxine XR  75 mg Oral Daily   Continuous Infusions: . sodium chloride Stopped (03/03/19 1429)  . sodium chloride 500 mL (03/05/19 0829)  . azithromycin Stopped  (03/04/19 1238)  . cefTRIAXone (ROCEPHIN)  IV Stopped (03/04/19 1323)  . dextrose 20 mL/hr at 03/05/19 0435  . dextrose 5 % and 0.45% NaCl 100 mL/hr at 03/05/19 0435  . potassium PHOSPHATE IVPB (in mmol)     PRN Meds:.sodium chloride, sodium chloride, acetaminophen **OR** acetaminophen, albuterol, bisacodyl, guaiFENesin-dextromethorphan, HYDROcodone-acetaminophen, morphine injection, ondansetron **OR** ondansetron (ZOFRAN) IV, senna-docusate Medications Prior to Admission:  Prior to  Admission medications   Medication Sig Start Date End Date Taking? Authorizing Provider  dexamethasone (DECADRON) 4 MG tablet Take 5 tablets (20 mg total) by mouth once a week. Patient taking differently: Take 20 mg by mouth every Sunday.  02/23/19  Yes Earlie Server, MD  diclofenac sodium (VOLTAREN) 1 % GEL Apply 1 application topically as needed. 04/21/18  Yes [provider]  ferrous sulfate 325 (65 FE) MG tablet Take 325 mg by mouth 2 (two) times daily with a meal.  10/12/18 10/12/19 Yes [provider]  lenalidomide (REVLIMID) 5 MG capsule Take 1 capsule (5 mg total) by mouth daily. Take for 21 days, then hold for 7 days. Repeat every 28 days. 02/14/19  Yes Earlie Server, MD  loperamide (IMODIUM) 2 MG capsule Take 1 capsule (2 mg total) by mouth See admin instructions. With onset of loose stool, take 23m followed by 229mevery 2 hours until 12 hours have passed without loose bowel movement. Maximum: 16 mg/day 02/24/19  Yes YuEarlie ServerMD  magnesium chloride (SLOW-MAG) 64 MG TBEC SR tablet Take 1 tablet (64 mg total) by mouth daily. 02/24/19  Yes YuEarlie ServerMD  meclizine (ANTIVERT) 25 MG tablet Take 12.5-25 mg by mouth every 4 (four) hours as needed for dizziness or nausea.    Yes [provider]  metFORMIN (GLUCOPHAGE) 500 MG tablet Take 500 mg by mouth daily. 09/30/18  Yes [provider]  metolazone (ZAROXOLYN) 2.5 MG tablet Take 2.5 mg by mouth daily as needed (feet swelling).  09/22/18 09/22/19 Yes  [provider]  metoprolol succinate (TOPROL-XL) 25 MG 24 hr tablet Take 25 mg by mouth at bedtime.  10/26/16  Yes [provider]  Multiple Vitamins-Minerals (PRESERVISION AREDS 2) CAPS Take 1 capsule by mouth 2 (two) times daily.   Yes [provider]  nitrofurantoin, macrocrystal-monohydrate, (MACROBID) 100 MG capsule Take 100 mg by mouth 2 (two) times a day. 03/01/19 03/08/19 Yes [provider]  ondansetron (ZOFRAN) 8 MG tablet Take 1 tablet (8 mg total) by mouth every 8 (eight) hours as needed for nausea, vomiting or refractory nausea / vomiting. 02/24/19  Yes YuEarlie ServerMD  potassium chloride (K-DUR) 10 MEQ tablet Take 10 mEq by mouth 2 (two) times daily. 09/07/18  Yes [provider]  torsemide (DEMADEX) 10 MG tablet Take 10 mg by mouth daily. 06/22/18 06/22/19 Yes [provider]  traMADol (ULTRAM) 50 MG tablet Take 50-100 mg by mouth every 6 (six) hours as needed for pain. 03/01/18  Yes [provider]  venlafaxine XR (EFFEXOR-XR) 37.5 MG 24 hr capsule Take 75 mg by mouth daily.  09/23/18  Yes [provider]  XARELTO 10 MG TABS tablet Take 10 mg by mouth daily.  11/04/16  Yes [provider]  famotidine (PEPCID) 20 MG tablet Take 1 tablet (20 mg total) by mouth daily. Patient not taking: Reported on 03/02/2019 02/23/19   YuEarlie ServerMD   Allergies  Allergen Reactions  . Tape Hives    Pt states that she gets whelps that spread out from the Bandaid Pt states that she gets whelps that spread out from the Bandaid Pt states that she gets whelps that spread out from the Bandaid Pt states that she gets whelps that spread out from the Bandaid Pt states that she gets whelps that spread out from the Bandaid   Review of Systems  Unable to perform ROS: Mental status change    Physical Exam Vitals signs and nursing note  reviewed.  Constitutional:      General: She is not in acute distress.    Appearance: She is ill-appearing.   HENT:     Head: Atraumatic.     Mouth/Throat:     Mouth: Mucous membranes are dry.  Cardiovascular:     Rate and Rhythm: Normal rate.  Pulmonary:     Effort: Pulmonary effort is normal. No respiratory distress.  Abdominal:     General: Abdomen is flat.     Tenderness: There is guarding.  Musculoskeletal:        General: Tenderness present. No swelling.  Skin:    General: Skin is warm and dry.  Neurological:     Comments: Does not respond, except to moan.   Psychiatric:     Comments: Does not respond, except to moan.      Vital Signs: BP 114/86 (BP Location: Left Arm)   Pulse 68   Temp 99.1 F (37.3 C) (Oral)   Resp 20   Ht 4' 11"  (1.499 m)   Wt 70.3 kg   SpO2 91%   BMI 31.30 kg/m  Pain Scale: PAINAD POSS *See Group Information*: 3-INTERVENTION REQUIRED,Unacceptable,Frequently drowsy, arousable, drifts off to sleep during conversation Pain Score: 2    SpO2: SpO2: 91 % O2 Device:SpO2: 91 % O2 Flow Rate: .O2 Flow Rate (L/min): 4 L/min  IO: Intake/output summary:   Intake/Output Summary (Last 24 hours) at 03/05/2019 1134 Last data filed at 03/05/2019 0435 Gross per 24 hour  Intake 3171.75 ml  Output -450 ml  Net 3621.75 ml    LBM: Last BM Date: (unable to assess) Baseline Weight: Weight: 70.3 kg Most recent weight: Weight: 70.3 kg     Palliative Assessment/Data:   Flowsheet Rows     Most Recent Value  Intake Tab  Referral Department  Hospitalist  Unit at Time of Referral  Med/Surg Unit  Palliative Care Primary Diagnosis  Cancer  Date Notified  03/02/19  Palliative Care Type  New Palliative care  Reason for referral  Clarify Goals of Care  Date of Admission  03/02/19  Date first seen by Palliative Care  03/05/19  # of days Palliative referral response time  3 Day(s)  # of days IP prior to Palliative referral  0  Clinical Assessment  Palliative Performance Scale Score  20%  Pain Max last 24 hours  Not able to report  Pain Min Last 24 hours  Not able to  report  Dyspnea Max Last 24 Hours  Not able to report  Dyspnea Min Last 24 hours  Not able to report  Psychosocial & Spiritual Assessment  Palliative Care Outcomes      Time In: 1020 Time Out: 1110 Time Total: 50 minutes  Greater than 50%  of this time was spent counseling and coordinating care related to the above assessment and plan.  Signed by: Drue Novel, NP   Please contact Palliative Medicine Team phone at [phone] for questions and concerns.  For individual provider: See Shea Evans

## 2019-03-05 NOTE — Care Management Important Message (Signed)
Important Message  Patient Details  Name: Cassie Huffman MRN: [id number] Date of Birth: 09-26-1926   Medicare Important Message Given:  Yes     Juliann Pulse [name] 03/05/2019, 11:29 AM

## 2019-03-05 NOTE — Consult Note (Signed)
[facility] for Electrolyte Monitoring and Replacement   Recent Labs: Potassium (mmol/L)  Date Value  03/05/2019 3.7  09/13/2014 3.6   Magnesium (mg/dL)  Date Value  03/05/2019 1.6 (L)   Calcium (mg/dL)  Date Value  03/05/2019 10.4 (H)   Calcium, Total (mg/dL)  Date Value  09/13/2014 8.7   Albumin (g/dL)  Date Value  03/05/2019 2.2 (L)  09/13/2014 3.5   Phosphorus (mg/dL)  Date Value  03/05/2019 2.4 (L)   Sodium (mmol/L)  Date Value  03/05/2019 137  09/13/2014 136   Corrected Ca: 11.84  Assessment: Pharmacy has been consulted to monitor and replenish electrolytes in 85yo patient.  Hospitalist treated the patient's hypercalcemia of malignancy (patient has MM) w/ pamidronate 90 mg IV x 1 over 4 hour infusion. Given pamidronate can cause hypokalemia, hypomagnesemia, hypophosphatemia and given patient was already hypokalemic she was administered KCI 10 mEq IV x 5 and Mag 2g IV x 1 PRIOR to administration of pamidronate.   Hypercalcemia being managed by oncology  Hypokalemia is now corrected  Presently, patient continues to have hypomagnesemia  and hypophosphatemia.    Goal of Therapy:  Electrolytes WNL  Plan:   Patient is receiving D51/2NS at 125 mL/hr  Will give 2 grams IV magnesium sulfate  Will give 64mmol potassium phosphate  Will check levels with AM labs and supplement as needed.  [name] ,PharmD Clinical Pharmacist 03/05/2019 8:21 AM

## 2019-03-05 NOTE — Progress Notes (Signed)
[facility]  ROUNDING NOTE   Subjective:   Daughter at bedside. Patient not responsive. Calcium 10.4.  Two consecutive days of hemodialysis for hypercalcemia  Objective:  Vital signs in last 24 hours:  Temp:  [97.7 F (36.5 C)-99.1 F (37.3 C)] 99.1 F (37.3 C) (07/06 0729) Pulse Rate:  [54-90] 68 (07/06 0729) Resp:  [15-20] 20 (07/06 0821) BP: (68-114)/(29-86) 114/86 (07/06 0729) SpO2:  [91 %-100 %] 91 % (07/06 0729) Weight:  [66.7 kg-70.3 kg] 70.3 kg (07/05 1630)  Weight change: 2.9 kg Filed Weights   03/04/19 0326 03/04/19 1345 03/04/19 1630  Weight: 66.7 kg 66.7 kg 70.3 kg    Intake/Output: I/O last 3 completed shifts: In: 4397.2 [I.V.:3697.2; IV Piggyback:700] Out: -300 [Urine:350]   Intake/Output this shift:  No intake/output data recorded.  Physical Exam: General: Obtunded  Head: Normocephalic, atraumatic. Dry oral mucosal membranes  Eyes: Anicteric  Neck: Supple, trachea midline  Lungs:  Clear to auscultation, normal effort  Heart: regular  Abdomen:  Soft, nontender, bowel sounds present  Extremities: No peripheral edema.  Neurologic: Obtunded  Skin: No lesions  Access: Right femoral dialysis catheter    Basic Metabolic Panel: Recent Labs  Lab 03/02/19 1306  03/03/19 0436 03/03/19 1628 03/03/19 1629 03/04/19 0609 03/04/19 1652 03/05/19 0554  NA  --   --  148*  --  143 138 138 137  K  --   --  2.9*  --  2.9* 4.0 3.5 3.7  CL  --   --  106  --  102 103 102 101  CO2  --   --  33*  --  34* 30 33* 32  GLUCOSE  --   --  96  --  100* 105* 116* 124*  BUN  --   --  41*  --  22 26* 11 17  CREATININE  --   --  1.50*  --  0.90 1.36* 0.61 1.08*  CALCIUM  --    < > >15.0*  --  12.5* 12.9* 9.5 10.4*  MG 1.5*  --  1.9  --  1.5* 1.2*  --  1.6*  PHOS  --    < > 4.2 1.9* 1.9* 2.0* 1.5* 2.4*   < > = values in this interval not displayed.    Liver Function Tests: Recent Labs  Lab 03/02/19 1105 03/03/19 0436 03/03/19 1629 03/04/19 0609  03/04/19 1652 03/05/19 0554  AST 26  --   --   --   --   --   ALT 13  --   --   --   --   --   ALKPHOS 69  --   --   --   --   --   BILITOT 1.9* 1.3*  --   --   --   --   PROT 7.0  --   --   --   --   --   ALBUMIN 3.2*  --  2.6* 2.4* 2.2* 2.2*   No results for input(s): LIPASE, AMYLASE in the last 168 hours. No results for input(s): AMMONIA in the last 168 hours.  CBC: Recent Labs  Lab 03/02/19 1005 03/03/19 0436  WBC 9.9 8.5  HGB 10.3* 10.3*  HCT 33.9* 34.9*  MCV 105.6* 109.4*  PLT 221 179    Cardiac Enzymes: No results for input(s): CKTOTAL, CKMB, CKMBINDEX, TROPONINI in the last 168 hours.  BNP: Invalid input(s): POCBNP  CBG: Recent Labs  Lab 03/04/19 2106 03/04/19 2358 03/05/19  0222 03/05/19 0730 03/05/19 1143  GLUCAP 61* 70 83 88 112*    Microbiology: Results for orders placed or performed during the hospital encounter of 03/02/19  Blood Culture (routine x 2)     Status: None (Preliminary result)   Collection Time: 03/02/19 10:06 AM   Specimen: BLOOD  Result Value Ref Range Status   Specimen Description BLOOD BLOOD RIGHT WRIST  Final   Special Requests   Final    BOTTLES DRAWN AEROBIC AND ANAEROBIC Blood Culture results may not be optimal due to an inadequate volume of blood received in culture bottles   Culture   Final    NO GROWTH 3 DAYS Performed at [facility], [address], [place], [place] [address]    Report Status PENDING  Incomplete  Blood Culture (routine x 2)     Status: None (Preliminary result)   Collection Time: 03/02/19 10:19 AM   Specimen: BLOOD  Result Value Ref Range Status   Specimen Description BLOOD LEFT ANTECUBITAL  Final   Special Requests   Final    BOTTLES DRAWN AEROBIC AND ANAEROBIC Blood Culture results may not be optimal due to an inadequate volume of blood received in culture bottles   Culture   Final    NO GROWTH 3 DAYS Performed at [facility], [address], [place], [place] [address]     Report Status PENDING  Incomplete  SARS Coronavirus 2 (CEPHEID- Performed in [place] hospital lab), Hosp Order     Status: None   Collection Time: 03/02/19 10:20 AM   Specimen: Nasopharyngeal Swab  Result Value Ref Range Status   SARS Coronavirus 2 NEGATIVE NEGATIVE Final    Comment: (NOTE) If result is NEGATIVE SARS-CoV-2 target nucleic acids are NOT DETECTED. The SARS-CoV-2 RNA is generally detectable in upper and lower  respiratory specimens during the acute phase of infection. The lowest  concentration of SARS-CoV-2 viral copies this assay can detect is 250  copies / mL. A negative result does not preclude SARS-CoV-2 infection  and should not be used as the sole basis for treatment or other  patient management decisions.  A negative result may occur with  improper specimen collection / handling, submission of specimen other  than nasopharyngeal swab, presence of viral mutation(s) within the  areas targeted by this assay, and inadequate number of viral copies  (<250 copies / mL). A negative result must be combined with clinical  observations, patient history, and epidemiological information. If result is POSITIVE SARS-CoV-2 target nucleic acids are DETECTED. The SARS-CoV-2 RNA is generally detectable in upper and lower  respiratory specimens dur ing the acute phase of infection.  Positive  results are indicative of active infection with SARS-CoV-2.  Clinical  correlation with patient history and other diagnostic information is  necessary to determine patient infection status.  Positive results do  not rule out bacterial infection or co-infection with other viruses. If result is PRESUMPTIVE POSTIVE SARS-CoV-2 nucleic acids MAY BE PRESENT.   A presumptive positive result was obtained on the submitted specimen  and confirmed on repeat testing.  While 2019 novel coronavirus  (SARS-CoV-2) nucleic acids may be present in the submitted sample  additional confirmatory testing may be  necessary for epidemiological  and / or clinical management purposes  to differentiate between  SARS-CoV-2 and other Sarbecovirus currently known to infect humans.  If clinically indicated additional testing with an alternate test  methodology [phone]) is advised. The SARS-CoV-2 RNA is generally  detectable in upper  and lower respiratory sp ecimens during the acute  phase of infection. The expected result is Negative. Fact Sheet for Patients:  StrictlyIdeas.no Fact Sheet for Healthcare Providers: BankingDealers.co.za This test is not yet approved or cleared by the [place] FDA and has been authorized for detection and/or diagnosis of SARS-CoV-2 by FDA under an Emergency Use Authorization (EUA).  This EUA will remain in effect (meaning this test can be used) for the duration of the COVID-19 declaration under Section 564(b)(1) of the Act, 21 U.S.C. section 360bbb-3(b)(1), unless the authorization is terminated or revoked sooner. Performed at [facility], [address] [place],  [address]     Coagulation Studies: No results for input(s): LABPROT, INR in the last 72 hours.  Urinalysis: No results for input(s): COLORURINE, LABSPEC, PHURINE, GLUCOSEU, HGBUR, BILIRUBINUR, KETONESUR, PROTEINUR, UROBILINOGEN, NITRITE, LEUKOCYTESUR in the last 72 hours.  Invalid input(s): APPERANCEUR    Imaging: No results found.   Medications:   . sodium chloride Stopped (03/03/19 1429)  . sodium chloride 500 mL (03/05/19 0829)  . azithromycin 500 mg (03/05/19 1217)  . cefTRIAXone (ROCEPHIN)  IV Stopped (03/04/19 1323)  . dextrose 20 mL/hr at 03/05/19 0435  . potassium PHOSPHATE IVPB (in mmol) 10 mmol (03/05/19 1229)   . chlorhexidine  15 mL Mouth Rinse BID  . Chlorhexidine Gluconate Cloth  6 each Topical Q0600  . cholestyramine  4 g Oral Daily  . dexamethasone  20 mg Oral Weekly  . enoxaparin (LOVENOX) injection  40 mg  Subcutaneous Q24H  . famotidine  20 mg Oral Daily  . ferrous sulfate  325 mg Oral BID WC  . loperamide  4 mg Oral Once  . mouth rinse  15 mL Mouth Rinse q12n4p  . multivitamin-lutein  1 capsule Oral BID  . venlafaxine XR  75 mg Oral Daily   sodium chloride, sodium chloride, acetaminophen **OR** acetaminophen, albuterol, bisacodyl, guaiFENesin-dextromethorphan, HYDROcodone-acetaminophen, morphine injection, ondansetron **OR** ondansetron (ZOFRAN) IV, senna-docusate  Assessment/ Plan:  Ms. Cassie Huffman is a 85 y.o. white female with atrial fibrillation, multiple myeloma, history of breast cancer, congestive heart failure, diabetes mellitus type 2, hypertension, who was admitted to [facility] on 03/02/2019 for evaluation of altered mental status and found to have hypercalcemia. Required two consecutive days of hemodialysis treatment.   1. Hypercalcemia.  2. Multiple Myeloma 3. Anemia with renal failure 4. Hypertension  At this time, dialysis is improving serum calcium levels but is not improving patient clinically. No indication to proceed with further hemodialysis treatments as this may cause more harm than being beneficial.  - Discontinue Dextrose/NS infusion.    LOS: 3 Sarath Kolluru 7/6/202012:48 PM

## 2019-03-05 NOTE — Plan of Care (Signed)
  Problem: Pain Managment: Goal: General experience of comfort will improve Outcome: Progressing   Problem: Safety: Goal: Ability to remain free from injury will improve Outcome: Progressing   Problem: Skin Integrity: Goal: Risk for impaired skin integrity will decrease Outcome: Progressing   

## 2019-03-05 NOTE — Progress Notes (Signed)
[facility] Hematology/Oncology Progress Note  Date of admission: 03/02/2019  Hospital day:  03/05/2019  Chief Complaint: Cassie Huffman is a 85 y.o. female with multiple myeloma who was admitted through the emergency room with altered mental status and hypercalcemia.  Subjective: Patient remains unresponsive.  Daughter at bedside. Contact number is [phone].  Review of Systems  Unable to perform ROS: Mental status change     Allergies:  Allergies  Allergen Reactions  . Tape Hives    Pt states that she gets whelps that spread out from the Bandaid Pt states that she gets whelps that spread out from the Bandaid Pt states that she gets whelps that spread out from the Bandaid Pt states that she gets whelps that spread out from the Bandaid Pt states that she gets whelps that spread out from the Bandaid    Scheduled Medications: . chlorhexidine  15 mL Mouth Rinse BID  . Chlorhexidine Gluconate Cloth  6 each Topical Q0600  . cholestyramine  4 g Oral Daily  . dexamethasone  20 mg Oral Weekly  . enoxaparin (LOVENOX) injection  40 mg Subcutaneous Q24H  . [START ON 03/06/2019] famotidine  10 mg Oral Daily  . ferrous sulfate  325 mg Oral BID WC  . loperamide  4 mg Oral Once  . mouth rinse  15 mL Mouth Rinse q12n4p  . multivitamin-lutein  1 capsule Oral BID  . venlafaxine XR  75 mg Oral Daily    Physical Exam: Blood pressure 114/86, pulse 68, temperature 99.1 F (37.3 C), temperature source Oral, resp. rate 20, height _0  (1.499 m), weight 154 lb 15.7 oz (70.3 kg), SpO2 91 %.   Physical Exam  Constitutional: No distress.  Elderly female lying in the bed with no acute distress.  HENT:  Head: Normocephalic and atraumatic.  Pulmonary/Chest: Effort normal and breath sounds normal. No respiratory distress.  Abdominal: Soft.  Musculoskeletal:     Comments: Moves her extremities randomly.  Neurological:  Unresponsive.  Grimaces to physical stimulation.  Skin:  Skin is dry. No rash noted.    Results for orders placed or performed during the hospital encounter of 03/02/19 (from the past 48 hour(s))  Glucose, capillary     Status: Abnormal   Collection Time: 03/03/19  9:15 PM  Result Value Ref Range   Glucose-Capillary 49 (L) 70 - 99 mg/dL   Comment 1 Notify RN    Comment 2 Document in Chart   Glucose, capillary     Status: Abnormal   Collection Time: 03/03/19  9:53 PM  Result Value Ref Range   Glucose-Capillary 119 (H) 70 - 99 mg/dL  Glucose, capillary     Status: Abnormal   Collection Time: 03/03/19 10:16 PM  Result Value Ref Range   Glucose-Capillary 103 (H) 70 - 99 mg/dL  Glucose, capillary     Status: None   Collection Time: 03/03/19 10:38 PM  Result Value Ref Range   Glucose-Capillary 94 70 - 99 mg/dL  Glucose, capillary     Status: None   Collection Time: 03/03/19 11:06 PM  Result Value Ref Range   Glucose-Capillary 88 70 - 99 mg/dL   Comment 1 Notify RN    Comment 2 Document in Chart   Glucose, capillary     Status: Abnormal   Collection Time: 03/04/19 12:19 AM  Result Value Ref Range   Glucose-Capillary 124 (H) 70 - 99 mg/dL  Glucose, capillary     Status: Abnormal   Collection Time: 03/04/19  1:05 AM  Result Value Ref Range   Glucose-Capillary 114 (H) 70 - 99 mg/dL   Comment 1 Notify RN    Comment 2 Document in Chart   Glucose, capillary     Status: Abnormal   Collection Time: 03/04/19  2:22 AM  Result Value Ref Range   Glucose-Capillary 125 (H) 70 - 99 mg/dL  Glucose, capillary     Status: Abnormal   Collection Time: 03/04/19  3:29 AM  Result Value Ref Range   Glucose-Capillary 102 (H) 70 - 99 mg/dL  Glucose, capillary     Status: None   Collection Time: 03/04/19  4:14 AM  Result Value Ref Range   Glucose-Capillary 91 70 - 99 mg/dL  Glucose, capillary     Status: Abnormal   Collection Time: 03/04/19  6:05 AM  Result Value Ref Range   Glucose-Capillary 103 (H) 70 - 99 mg/dL  Lactic acid, plasma     Status:  Abnormal   Collection Time: 03/04/19  6:08 AM  Result Value Ref Range   Lactic Acid, Venous 2.1 (HH) 0.5 - 1.9 mmol/L    Comment: CRITICAL RESULT CALLED TO, READ BACK BY AND VERIFIED WITH MARICAR KIMREY _0  03/04/19 MJU Performed at [facility], [address] [place], [place] [address]   Renal function panel     Status: Abnormal   Collection Time: 03/04/19  6:09 AM  Result Value Ref Range   Sodium 138 135 - 145 mmol/L   Potassium 4.0 3.5 - 5.1 mmol/L   Chloride 103 98 - 111 mmol/L   CO2 30 22 - 32 mmol/L   Glucose, Bld 105 (H) 70 - 99 mg/dL   BUN 26 (H) 8 - 23 mg/dL   Creatinine, Ser 1.36 (H) 0.44 - 1.00 mg/dL   Calcium 12.9 (H) 8.9 - 10.3 mg/dL   Phosphorus 2.0 (L) 2.5 - 4.6 mg/dL   Albumin 2.4 (L) 3.5 - 5.0 g/dL   GFR calc non Af Amer 34 (L) >60 mL/min   GFR calc Af Amer 40 (L) >60 mL/min   Anion gap 5 5 - 15    Comment: Performed at [facility], [address] [place], [place] [address]  Magnesium     Status: Abnormal   Collection Time: 03/04/19  6:09 AM  Result Value Ref Range   Magnesium 1.2 (L) 1.7 - 2.4 mg/dL    Comment: Performed at [facility], [address] [place], [place] [address]  Glucose, capillary     Status: None   Collection Time: 03/04/19  7:53 AM  Result Value Ref Range   Glucose-Capillary 79 70 - 99 mg/dL   Comment 1 Notify RN    Comment 2 Document in Chart   Glucose, capillary     Status: Abnormal   Collection Time: 03/04/19 11:26 AM  Result Value Ref Range   Glucose-Capillary 126 (H) 70 - 99 mg/dL   Comment 1 Notify RN    Comment 2 Document in Chart   Renal function panel     Status: Abnormal   Collection Time: 03/04/19  4:52 PM  Result Value Ref Range   Sodium 138 135 - 145 mmol/L   Potassium 3.5 3.5 - 5.1 mmol/L   Chloride 102 98 - 111 mmol/L   CO2 33 (H) 22 - 32 mmol/L   Glucose, Bld 116 (H) 70 - 99 mg/dL   BUN 11 8 - 23 mg/dL   Creatinine, Ser 0.61 0.44 - 1.00 mg/dL   Calcium 9.5  8.9 - 10.3 mg/dL    Phosphorus 1.5 (L) 2.5 - 4.6 mg/dL   Albumin 2.2 (L) 3.5 - 5.0 g/dL   GFR calc non Af Amer >60 >60 mL/min   GFR calc Af Amer >60 >60 mL/min   Anion gap 3 (L) 5 - 15    Comment: Performed at [facility], [address] [place], [place] [address]  Glucose, capillary     Status: None   Collection Time: 03/04/19  5:20 PM  Result Value Ref Range   Glucose-Capillary 73 70 - 99 mg/dL   Comment 1 Notify RN    Comment 2 Document in Chart   Glucose, capillary     Status: Abnormal   Collection Time: 03/04/19  9:06 PM  Result Value Ref Range   Glucose-Capillary 61 (L) 70 - 99 mg/dL  Glucose, capillary     Status: None   Collection Time: 03/04/19 11:58 PM  Result Value Ref Range   Glucose-Capillary 70 70 - 99 mg/dL  Glucose, capillary     Status: None   Collection Time: 03/05/19  2:22 AM  Result Value Ref Range   Glucose-Capillary 83 70 - 99 mg/dL  Renal function panel     Status: Abnormal   Collection Time: 03/05/19  5:54 AM  Result Value Ref Range   Sodium 137 135 - 145 mmol/L   Potassium 3.7 3.5 - 5.1 mmol/L   Chloride 101 98 - 111 mmol/L   CO2 32 22 - 32 mmol/L   Glucose, Bld 124 (H) 70 - 99 mg/dL   BUN 17 8 - 23 mg/dL   Creatinine, Ser 1.08 (H) 0.44 - 1.00 mg/dL   Calcium 10.4 (H) 8.9 - 10.3 mg/dL   Phosphorus 2.4 (L) 2.5 - 4.6 mg/dL   Albumin 2.2 (L) 3.5 - 5.0 g/dL   GFR calc non Af Amer 45 (L) >60 mL/min   GFR calc Af Amer 52 (L) >60 mL/min   Anion gap 4 (L) 5 - 15    Comment: Performed at [facility], [address] [place], [place] [address]  Magnesium     Status: Abnormal   Collection Time: 03/05/19  5:54 AM  Result Value Ref Range   Magnesium 1.6 (L) 1.7 - 2.4 mg/dL    Comment: Performed at [facility], [address] [place], [place] [address]  Glucose, capillary     Status: None   Collection Time: 03/05/19  7:30 AM  Result Value Ref Range   Glucose-Capillary 88 70 - 99 mg/dL   Comment 1 Notify RN    Comment 2 Document in Chart    Glucose, capillary     Status: Abnormal   Collection Time: 03/05/19 11:43 AM  Result Value Ref Range   Glucose-Capillary 112 (H) 70 - 99 mg/dL   Comment 1 Notify RN    Comment 2 Document in Chart    No results found.  Assessment:  Cassie Huffman is a 85 y.o. female with with multiple myeloma  was admitted due to altered mental status, found to have hypercalcemia and acute kidney failure.   #Hypercalcemia, status post IV fluid, Zometa and calcitonin. Also status post dialysis. Calcium at 10.4 today.  Albumin 2.2, corrected calcium level 11.8. Patient does not have hypercalcemia prior to current admission.  Hypercalcemia is likely due to dehydration rather than from multiple myeloma.  #AKI secondary to dehydration. #Pneumonia on antibiotic treatments. #Altered mental status, not improved.  I had a lengthy discussion with patient's daughter who  is at bedside. Altered mental status likely secondary to toxic encephalopathy due to acute illness/dehydration.  Her mental status may lag behind the improvement of blood work, possible improvement in the future.  Anticipate to resume multiple myeloma treatments as well as outpatient bisphosphonate treatment to control calcium level.   Daughter appreciated explanation and she is not interested in patient pursuing any additional treatments of multiple at this point.  She has discussed with her siblings and family prefers to proceed with hospice given patient's advanced age and multiple medical problems.  CODE STATUS is DNR/DNI. Consult hospice service.  We spent sufficient time to discuss many aspect of care, questions were answered to patient's satisfaction. Total face to face encounter time for this patient visit was 25 min. >50% of the time was  spent in counseling and coordination of care.    Earlie Server, MD  03/05/2019, 4:53 PM

## 2019-03-06 LAB — VITAMIN D 25 HYDROXY (VIT D DEFICIENCY, FRACTURES): Vit D, 25-Hydroxy: 54.2 ng/mL (ref 30.0–100.0)

## 2019-03-06 LAB — GLUCOSE, CAPILLARY
Glucose-Capillary: 109 mg/dL — ABNORMAL HIGH (ref 70–99)
Glucose-Capillary: 118 mg/dL — ABNORMAL HIGH (ref 70–99)

## 2019-03-06 LAB — CALCITRIOL (1,25 DI-OH VIT D): Vit D, 1,25-Dihydroxy: 23.2 pg/mL (ref 19.9–79.3)

## 2019-03-06 MED ORDER — MORPHINE SULFATE (CONCENTRATE) 10 MG/0.5ML PO SOLN: 10.0000 mg | ORAL | Status: DC | PRN

## 2019-03-06 MED ORDER — ONDANSETRON HCL 4 MG PO TABS: 4.0000 mg | ORAL_TABLET | Freq: Four times a day (QID) | ORAL | 0 refills | Status: AC | PRN

## 2019-03-06 MED ORDER — ACETAMINOPHEN 650 MG RE SUPP: 650.0000 mg | Freq: Four times a day (QID) | RECTAL | 0 refills | Status: AC | PRN

## 2019-03-06 MED ORDER — MORPHINE SULFATE (CONCENTRATE) 10 MG/0.5ML PO SOLN: 10.0000 mg | ORAL | Status: AC | PRN

## 2019-03-06 MED ORDER — ORAL CARE MOUTH RINSE: 15.0000 mL | Freq: Two times a day (BID) | OROMUCOSAL | 0 refills | Status: AC

## 2019-03-06 NOTE — TOC Progression Note (Signed)
Transition of Care [facility]) - Progression Note    Patient Details  Name: Cassie Huffman MRN: [id number] Date of Birth: 04/04/1927  [facility]) CM/SW Contact  Ross Ludwig, [place] Phone Number: 03/06/2019, 12:45 PM  Clinical Narrative:     CSW received phone call from [facility] facility, and they can accept patient this evening around 6pm.  CSW updated bedside nurse, and physician, hospice facility will contact patient's daughter Jimmy Picket, [phone].    Expected Discharge Plan: [place] Barriers to Discharge: Continued Medical Work up, Other (comment)(No bed availability for hospice facility.)  Expected Discharge Plan and Services Expected Discharge Plan: [place] In-house Referral: Clinical Social Work   Post Acute Care Choice: Hospice Living arrangements for the past 2 months: Single Family Home                 DME Arranged: N/A DME Agency: NA     Representative spoke with at DME Agency: na   [place]: NA         Social Determinants of Health ([place]) Interventions    Readmission Risk Interventions No flowsheet data found.

## 2019-03-06 NOTE — Discharge Summary (Signed)
[facility] NAME: Cassie Huffman    MR#:  [id number]  DATE OF BIRTH:  07/17/1927  DATE OF ADMISSION:  03/02/2019 ADMITTING PHYSICIAN: Demetrios Loll, MD  DATE OF DISCHARGE: 03/06/2019  PRIMARY CARE PHYSICIAN: Kirk Ruths, MD    ADMISSION DIAGNOSIS:  Hypercalcemia [E83.52] Altered mental status, unspecified altered mental status type [R41.82]  DISCHARGE DIAGNOSIS:  Active Problems:   Hypercalcemia   Altered mental status   Palliative care by specialist   Encounter for hospice care discussion   SECONDARY DIAGNOSIS:   Past Medical History:  Diagnosis Date  . A-fib ([place])   . Breast cancer ([place])   . Cancer ([place])   . CHF (congestive heart failure) ([place])   . Diabetes mellitus without complication ([place])   . Hypertension   . Multiple myeloma ([place])   . Umbilical hernia     HOSPITAL COURSE:   1.  Acute metabolic encephalopathy.  Patient has not regained her mental status despite getting her calcium lower.  Patient's daughter had decided to go hospice home and comfort care.  Symptom management as per hospice protocol. 2.  Severe hypercalcemia on presentation with a calcium of greater than 15.  Last calcium was 10.4.  No more blood draws because of comfort care.  Patient did receive Zometa and calcitonin while here in the hospital. 3.  Severe hypokalemia, hypomagnesemia.  Electrolytes replaced during hospital course.  No further blood draws. 4.  Acute kidney injury secondary to dehydration 5.  Acute respiratory failure secondary to hypoxia and pneumonia.  Patient on Rocephin and Zithromax today.  Since patient will be comfort care measures I will stop antibiotics. 6.  Multiple myeloma and history of breast cancer likely the cause of the patient's hypercalcemia. 7.  Chronic atrial fibrillation.  Unable to take blood thinners. 8.  Type 2 diabetes holding metformin 9.  History of hypertension holding oral medications  DISCHARGE  CONDITIONS:   Guarded  CONSULTS OBTAINED:  Oncology Palliative care  DRUG ALLERGIES:   Allergies  Allergen Reactions  . Tape Hives    Pt states that she gets whelps that spread out from the Banda[place] Pt states that she gets whelps that spread out from the Bandaid Pt states that she gets whelps that spread out from the Bandaid Pt states that she gets whelps that spread out from the [place] Pt states that she gets whelps that spread out from the [place]:   Allergies as of 03/06/2019      Reactions   Tape Hives   Pt states that she gets whelps that spread out from the Bandaid Pt states that she gets whelps that spread out from the Bandaid Pt states that she gets whelps that spread out from the Bandaid Pt states that she gets whelps that spread out from the Bandaid Pt states that she gets whelps that spread out from the Bandaid      Medication List    STOP taking these medications   dexamethasone 4 MG tablet Commonly known as: DECADRON   diclofenac sodium 1 % Gel Commonly known as: VOLTAREN   famotidine 20 MG tablet Commonly known as: PEPCID   ferrous sulfate 325 (65 FE) MG tablet   lenalidomide 5 MG capsule Commonly known as: REVLIMID   loperamide 2 MG capsule Commonly known as: IMODIUM   magnesium chloride 64 MG Tbec SR tablet Commonly known as: SLOW-MAG   meclizine 25 MG tablet Commonly known as:  ANTIVERT   metFORMIN 500 MG tablet Commonly known as: GLUCOPHAGE   metolazone 2.5 MG tablet Commonly known as: ZAROXOLYN   metoprolol succinate 25 MG 24 hr tablet Commonly known as: TOPROL-XL   nitrofurantoin (macrocrystal-monohydrate) 100 MG capsule Commonly known as: MACROBID   potassium chloride 10 MEQ tablet Commonly known as: K-DUR   PreserVision AREDS 2 Caps   torsemide 10 MG tablet Commonly known as: DEMADEX   traMADol 50 MG tablet Commonly known as: ULTRAM   venlafaxine XR 37.5 MG 24 hr capsule Commonly known as:  EFFEXOR-XR   Xarelto 10 MG Tabs tablet Generic drug: rivaroxaban     TAKE these medications   acetaminophen 650 MG suppository Commonly known as: TYLENOL Place 1 suppository (650 mg total) rectally every 6 (six) hours as needed for mild pain (or Fever >/= 101).   morphine CONCENTRATE 10 MG/0.5ML Soln concentrated solution Take 0.5 mLs (10 mg total) by mouth every 2 (two) hours as needed for moderate pain, severe pain or shortness of breath.   mouth rinse Liqd solution 15 mLs by Mouth Rinse route 2 times daily at 12 noon and 4 pm.   ondansetron 4 MG tablet Commonly known as: ZOFRAN Take 1 tablet (4 mg total) by mouth every 6 (six) hours as needed for nausea. What changed:   medication strength  how much to take  when to take this  reasons to take this        DISCHARGE INSTRUCTIONS:    Follow-up with hospice team 1 day  If you experience worsening of your admission symptoms, develop shortness of breath, life threatening emergency, suicidal or homicidal thoughts you must seek medical attention immediately by calling 911 or calling your MD immediately  if symptoms less severe.  You Must read complete instructions/literature along with all the possible adverse reactions/side effects for all the Medicines you take and that have been prescribed to you. Take any new Medicines after you have completely understood and accept all the possible adverse reactions/side effects.   Please note  You were cared for by a hospitalist during your hospital stay. If you have any questions about your discharge medications or the care you received while you were in the hospital after you are discharged, you can call the unit and asked to speak with the hospitalist on call if the hospitalist that took care of you is not available. Once you are discharged, your primary care physician will handle any further medical issues. Please note that NO REFILLS for any discharge medications will be authorized  once you are discharged, as it is imperative that you return to your primary care physician (or establish a relationship with a primary care physician if you do not have one) for your aftercare needs so that they can reassess your need for medications and monitor your lab values.    Today   CHIEF COMPLAINT:   Chief Complaint  Patient presents with  . Altered Mental Status    HISTORY OF PRESENT ILLNESS:  Cassie Huffman  is a 85 y.o. female came in with altered mental status and found to have hypercalcemia   VITAL SIGNS:  Blood pressure (!) 118/51, pulse 69, temperature 98.3 F (36.8 C), temperature source Oral, resp. rate 19, height 4' 11"  (1.499 m), weight 70.3 kg, SpO2 100 %.  PHYSICAL EXAMINATION:  GENERAL:  85 y.o.-year-old patient lying in the bed with no acute distress.  EYES: Pupils equal, round, reactive to light and accommodation. No scleral icterus. Extraocular muscles intact.  HEENT: Head atraumatic, normocephalic. Oropharynx and nasopharynx clear.  NECK:  Supple, no jugular venous distention. No thyroid enlargement, no tenderness.  LUNGS: Decreased breath sounds bilateral, no wheezing, rales,rhonchi or crepitation. No use of accessory muscles of respiration.  CARDIOVASCULAR: S1, S2 normal. No murmurs, rubs, or gallops.  ABDOMEN: Soft, non-tender, non-distended. Bowel sounds present. No organomegaly or mass.  EXTREMITIES: No pedal edema, cyanosis, or clubbing.  NEUROLOGIC: Patient mumbles with sternal rub. PSYCHIATRIC: The patient is lethargic.  SKIN: No obvious rash, lesion, or ulcer.   DATA REVIEW:   CBC Recent Labs  Lab 03/03/19 0436  WBC 8.5  HGB 10.3*  HCT 34.9*  PLT 179    Chemistries  Recent Labs  Lab 03/02/19 1105  03/03/19 0436  03/05/19 0554  NA 145  --  148*   < > 137  K 2.6*  --  2.9*   < > 3.7  CL 99  --  106   < > 101  CO2 35*  --  33*   < > 32  GLUCOSE 134*  --  96   < > 124*  BUN 43*  --  41*   < > 17  CREATININE 1.43*  --  1.50*   <  > 1.08*  CALCIUM >15.0*   < > >15.0*   < > 10.4*  MG  --    < > 1.9   < > 1.6*  AST 26  --   --   --   --   ALT 13  --   --   --   --   ALKPHOS 69  --   --   --   --   BILITOT 1.9*  --  1.3*  --   --    < > = values in this interval not displayed.    Microbiology Results  Results for orders placed or performed during the hospital encounter of 03/02/19  Blood Culture (routine x 2)     Status: None (Preliminary result)   Collection Time: 03/02/19 10:06 AM   Specimen: BLOOD  Result Value Ref Range Status   Specimen Description BLOOD BLOOD RIGHT WRIST  Final   Special Requests   Final    BOTTLES DRAWN AEROBIC AND ANAEROBIC Blood Culture results may not be optimal due to an inadequate volume of blood received in culture bottles   Culture   Final    NO GROWTH 4 DAYS Performed at [facility], [address] [place], [place] [address]    Report Status PENDING  Incomplete  Blood Culture (routine x 2)     Status: None (Preliminary result)   Collection Time: 03/02/19 10:19 AM   Specimen: BLOOD  Result Value Ref Range Status   Specimen Description BLOOD LEFT ANTECUBITAL  Final   Special Requests   Final    BOTTLES DRAWN AEROBIC AND ANAEROBIC Blood Culture results may not be optimal due to an inadequate volume of blood received in culture bottles   Culture   Final    NO GROWTH 4 DAYS Performed at [facility], [address] [place], [place] [address]    Report Status PENDING  Incomplete  SARS Coronavirus 2 (CEPHEID- Performed in [place] hospital lab), Hosp Order     Status: None   Collection Time: 03/02/19 10:20 AM   Specimen: Nasopharyngeal Swab  Result Value Ref Range Status   SARS Coronavirus 2 NEGATIVE NEGATIVE Final    Comment: (NOTE) If result is NEGATIVE SARS-CoV-2 target  nucleic acids are NOT DETECTED. The SARS-CoV-2 RNA is generally detectable in upper and lower  respiratory specimens during the acute phase of infection. The lowest  concentration  of SARS-CoV-2 viral copies this assay can detect is 250  copies / mL. A negative result does not preclude SARS-CoV-2 infection  and should not be used as the sole basis for treatment or other  patient management decisions.  A negative result may occur with  improper specimen collection / handling, submission of specimen other  than nasopharyngeal swab, presence of viral mutation(s) within the  areas targeted by this assay, and inadequate number of viral copies  (<250 copies / mL). A negative result must be combined with clinical  observations, patient history, and epidemiological information. If result is POSITIVE SARS-CoV-2 target nucleic acids are DETECTED. The SARS-CoV-2 RNA is generally detectable in upper and lower  respiratory specimens dur ing the acute phase of infection.  Positive  results are indicative of active infection with SARS-CoV-2.  Clinical  correlation with patient history and other diagnostic information is  necessary to determine patient infection status.  Positive results do  not rule out bacterial infection or co-infection with other viruses. If result is PRESUMPTIVE POSTIVE SARS-CoV-2 nucleic acids MAY BE PRESENT.   A presumptive positive result was obtained on the submitted specimen  and confirmed on repeat testing.  While 2019 novel coronavirus  (SARS-CoV-2) nucleic acids may be present in the submitted sample  additional confirmatory testing may be necessary for epidemiological  and / or clinical management purposes  to differentiate between  SARS-CoV-2 and other Sarbecovirus currently known to infect humans.  If clinically indicated additional testing with an alternate test  methodology [phone]) is advised. The SARS-CoV-2 RNA is generally  detectable in upper and lower respiratory sp ecimens during the acute  phase of infection. The expected result is Negative. Fact Sheet for Patients:  StrictlyIdeas.no Fact Sheet for Healthcare  Providers: BankingDealers.co.za This test is not yet approved or cleared by the [place] FDA and has been authorized for detection and/or diagnosis of SARS-CoV-2 by FDA under an Emergency Use Authorization (EUA).  This EUA will remain in effect (meaning this test can be used) for the duration of the COVID-19 declaration under Section 564(b)(1) of the Act, 21 U.S.C. section 360bbb-3(b)(1), unless the authorization is terminated or revoked sooner. Performed at [facility], [address], [place], [place] [address]      Management plans discussed with patient's daughter yesterday and we were awaiting hospice home beds which became available for today.  CODE STATUS:     Code Status Orders  (From admission, onward)         Start     Ordered   03/02/19 1346  Do not attempt resuscitation (DNR)  Continuous    Question Answer Comment  In the event of cardiac or respiratory ARREST Do not call a "code blue"   In the event of cardiac or respiratory ARREST Do not perform Intubation, CPR, defibrillation or ACLS   In the event of cardiac or respiratory ARREST Use medication by any route, position, wound care, and other measures to relive pain and suffering. May use oxygen, suction and manual treatment of airway obstruction as needed for comfort.      03/02/19 1345        Code Status History    This patient has a current code status but no historical code status.   Advance Care Planning Activity    Advance Directive Documentation  Most Recent Value  Type of Advance Directive  Healthcare Power of Attorney  Pre-existing out of facility DNR order (yellow form or pink MOST form)  -  "MOST" Form in Place?  -      TOTAL TIME TAKING CARE OF THIS PATIENT: 35 minutes.    Loletha Grayer M.D on 03/06/2019 at 1:09 PM  Between 7am to 6pm - Pager - [phone]  After 6pm go to [email or web address] - password [email or web address]  [employer] Office   [phone]  CC: Primary care physician; Kirk Ruths, MD

## 2019-03-06 NOTE — Progress Notes (Signed)
Palliative: Mrs. Wilkowski is lying quietly in bed.  She appears relatively comfortable, but acutely/chronically ill and frail.  There is no family at bedside at this time.  Family has elected comfort and dignity at end-of-life, residential hospice to let nature take its course.  Chart reviewed, orders updated to reflect comfort measures.  Conference with social work related to disposition plan.  Plan: Comfort and dignity at end-of-life, residential hospice with [facility] location.    3 minutes Quinn Axe, NP Palliative Medicine Team Team Phone # [phone] Greater than 50% of this time was spent counseling and coordinating care related to the above assessment and plan.

## 2019-03-06 NOTE — Progress Notes (Signed)
Follow up call made to Maylon Cos SW to notify him that we do have a hospice home bed for Ms. bischof tonight.  I called and spoke with Cathie, patients daughter- to explain hospice services and offer a bed.  She accepts the bed and understands that our social worker, Clenton Pare will contact her to complete admission consents.  EMS transport set up by this RN for pick up at 1800.  Patient's nurse, Serenity will call report to the hospice home admission line.  Discharge summary complete and faxed to the hospice team.  Thank you for allowing [facility] participate in this patients care.  Arline Asp, RN Clinical Nurse Liaison [facility] [phone] cell

## 2019-03-06 NOTE — TOC Transition Note (Addendum)
Transition of Care [facility]) - CM/SW Discharge Note   Patient Details  Name: Cassie Huffman MRN: [id number] Date of Birth: 19-Aug-1927  Transition of Care [facility]) CM/SW Contact:  Ross Ludwig, LCSW Phone Number: 03/06/2019, 3:56 PM   Clinical Narrative:     CSW received phone call that patient has a bed available for the [facility] hospice home for tonight.  CSW contacted physician and bedside nurse to let them know that patient is able to discharge today.  Patient to be d/c'ed today to [facility] facility.  Patient and family agreeable to plans will transport via ems RN to call report.  Final next level of care: [place] Barriers to Discharge: No Barriers Identified   Patient Goals and CMS Choice Patient states their goals for this hospitalization and ongoing recovery are:: To go to hospice facility for end of life care. CMS Medicare.gov Compare Post Acute Care list provided to:: Patient Represenative (must comment) Choice offered to / list presented to : Adult Children  Discharge Placement    Plan to discharge to hospice facility for today.            Patient chooses bed at: Other - please specify in the comment section below:([facility] hospice facility) Patient to be transferred to facility by: [facility] Name of family member notified: Daughter [name] [phone] Patient and family notified of of transfer: 03/06/19  Discharge Plan and Services In-house Referral: Clinical Social Work   Post Acute Care Choice: Hospice          DME Arranged: N/A DME Agency: NA     Representative spoke with at DME Agency: na HH Arranged: NA [place] Agency: NA        Social Determinants of Health ([place]) Interventions     Readmission Risk Interventions No flowsheet data found.

## 2019-03-06 NOTE — Consult Note (Signed)
PHARMACY CONSULT NOTE  Pharmacy Consult for Electrolyte Monitoring and Replacement   Recent Labs: Potassium (mmol/L)  Date Value  03/05/2019 3.7  09/13/2014 3.6   Magnesium (mg/dL)  Date Value  03/05/2019 1.6 (L)   Calcium (mg/dL)  Date Value  03/05/2019 10.4 (H)   Calcium, Total (PTH) (mg/dL)  Date Value  03/03/2019 12.7 (H)   Albumin (g/dL)  Date Value  03/05/2019 2.2 (L)  09/13/2014 3.5   Phosphorus (mg/dL)  Date Value  03/05/2019 2.4 (L)   Sodium (mmol/L)  Date Value  03/05/2019 137  09/13/2014 136   Corrected Ca: 11.84  Assessment: Pharmacy has been consulted to monitor and replenish electrolytes in 83 YOF with a  PMH significant for A. fib, breast cancer, multiple myeloma, CHF, hypertension, diabetes and umbilical hernia admitted on 03/02/2019 with hypercalcemia. Palliative care consult yesterday and the patient's family will proceed with residential hospice care.   Hypercalcemia being managed by oncology  Orders for electrolytes for today were discontinued by nephrology.    Goal of Therapy:  Electrolytes WNL  Plan:  No interventions by pharmacy at this time.   [name] ,PharmD Clinical Pharmacist 03/06/2019 7:31 AM

## 2019-03-06 NOTE — Progress Notes (Signed)
Right femoral line left in place for d/c as only working line. Marcene Brawn of hospice aware that line would be left if no working IVs.

## 2019-03-06 NOTE — Progress Notes (Signed)
Report given to Hospice home. EMS to be arranged for transportation around 6pm.

## 2019-03-06 NOTE — Progress Notes (Signed)
[facility] Daily Progress Note    Subjective: 3 Days Post-Op: Ultrasound-guided placement of a right femoral vein trialysis catheter  Family is moving forward with hospice / palliative care. There will be no need for permcath placement in the setting of hospice.  Vascular surgery to sign off at this time. If situation changes, please re-consult.  Discussed with Dr. Ellis Parents Alenna Russell PA-C 03/06/2019 12:44 PM

## 2019-03-07 LAB — CULTURE, BLOOD (ROUTINE X 2)
Culture: NO GROWTH
Culture: NO GROWTH

## 2019-03-08 LAB — VITAMIN A: Vitamin A (Retinoic Acid): 14.5 ug/dL — ABNORMAL LOW (ref 22.0–69.5)

## 2019-03-09 ENCOUNTER — Ambulatory Visit: Payer: Medicare Other | Admitting: Oncology

## 2019-03-09 ENCOUNTER — Other Ambulatory Visit: Payer: Medicare Other

## 2019-03-10 LAB — PTH-RELATED PEPTIDE: PTH-related peptide: <2 pmol/L

## 2019-03-20 ENCOUNTER — Other Ambulatory Visit: Payer: Self-pay | Admitting: *Deleted

## 2019-03-31 DEATH — deceased

## 2020-01-21 IMAGING — CT CT CERVICAL SPINE W/O CM
5 of 8 series · 14 of 33 positions shown, 15 images · non-contrast
Comparison: 10/03/2018

CLINICAL DATA: Fall

EXAM:
CT HEAD WITHOUT CONTRAST
CT CERVICAL SPINE WITHOUT CONTRAST
TECHNIQUE: Multidetector CT imaging of the head and cervical spine was
performed following the standard protocol without intravenous
contrast. Multiplanar CT image reconstructions of the cervical spine
were also generated.

[Series 5: head bone · axial · 0.41mm/px · z∈[-62,-10]mm · 2 of 79 slices shown]
[im 27/79  bone]
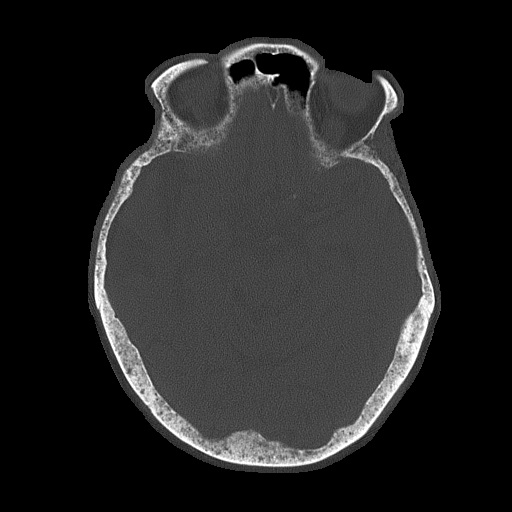
[im 53/79  bone]
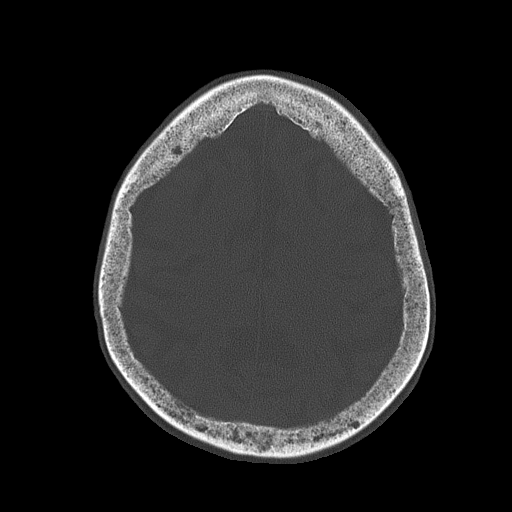

[Series 6: head without cor · coronal · non-contrast · 0.31mm/px · 3 of 67 slices shown]
[im 17/67  bone]
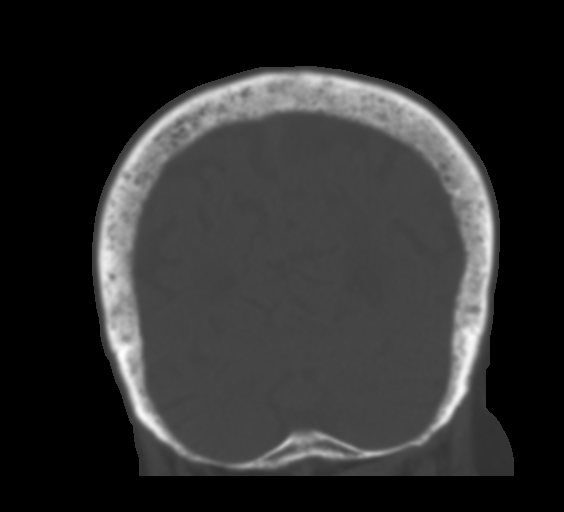
[im 34/67  bone]
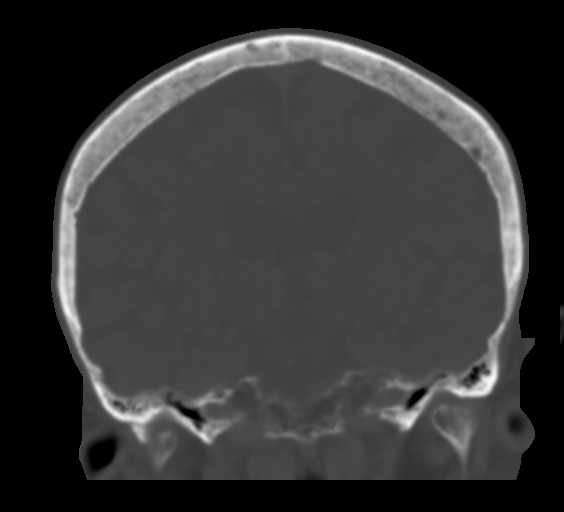
[im 50/67  bone]
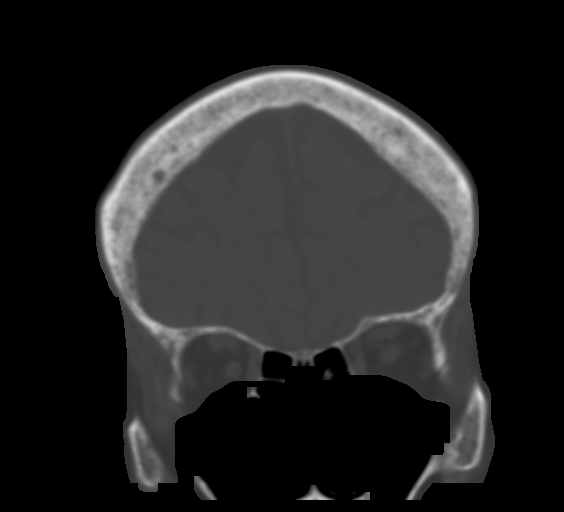

[Series 8: c_spine 2.0 st · axial · 0.46mm/px · z∈[-192,-144]mm · 2 of 72 slices shown, 3 images]
[im 24/72  soft-tissue]
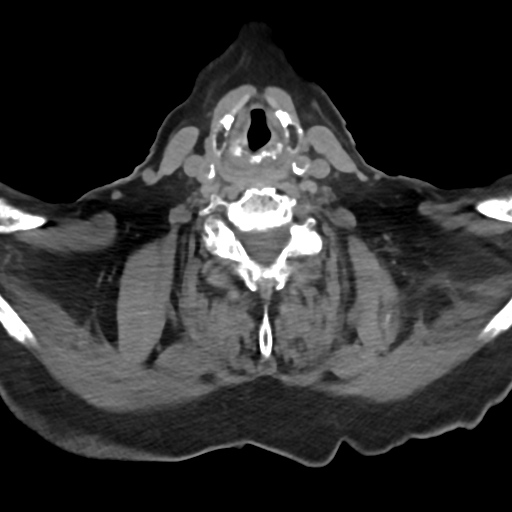
[im 24/72  bone]
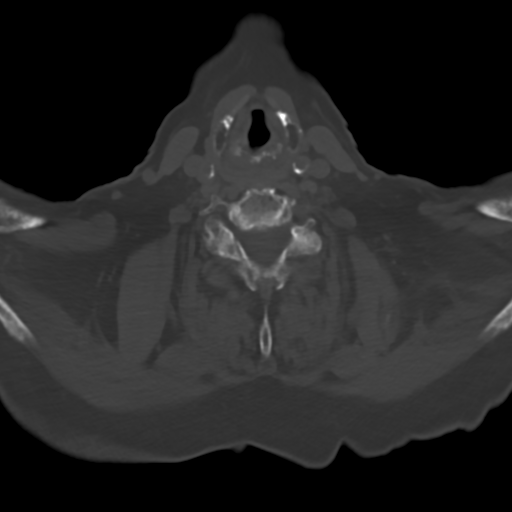
[im 48/72  bone]
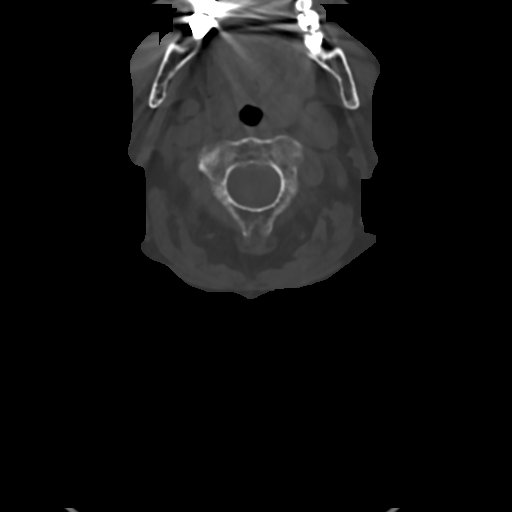

[Series 10: c_spine 2.0 sag bone · sagittal · 0.23mm/px · 5 of 61 slices shown]
[im 11/61  bone]
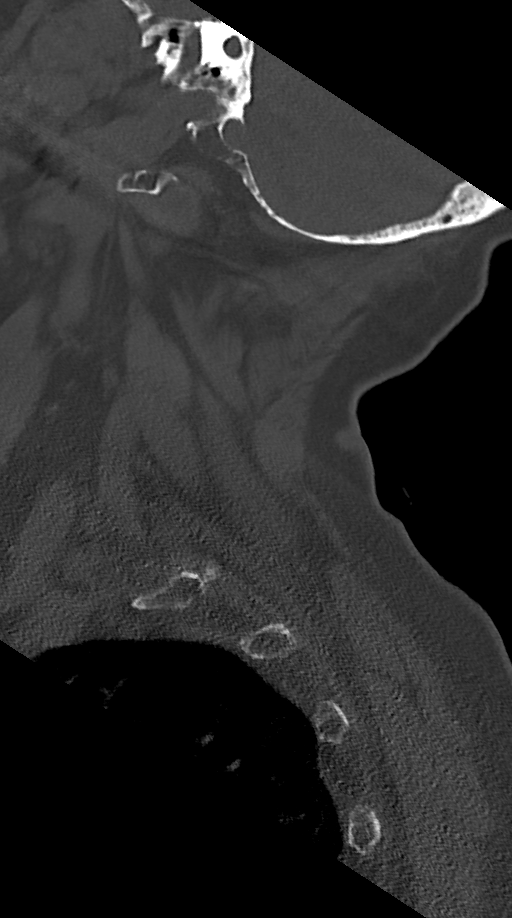
[im 21/61  bone]
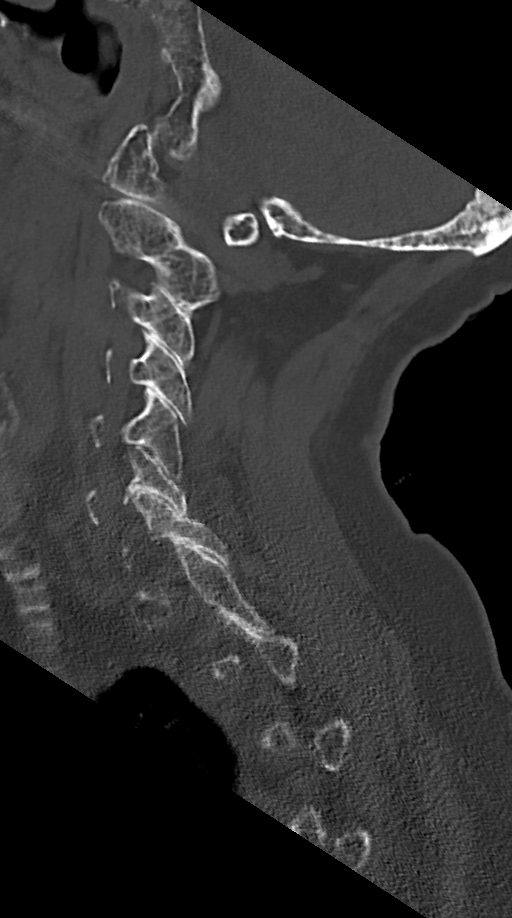
[im 31/61  bone]
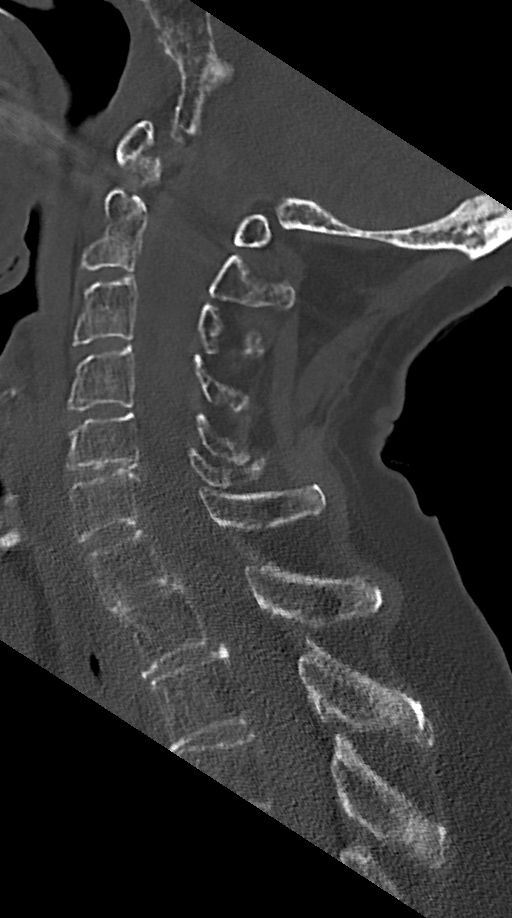
[im 41/61  bone]
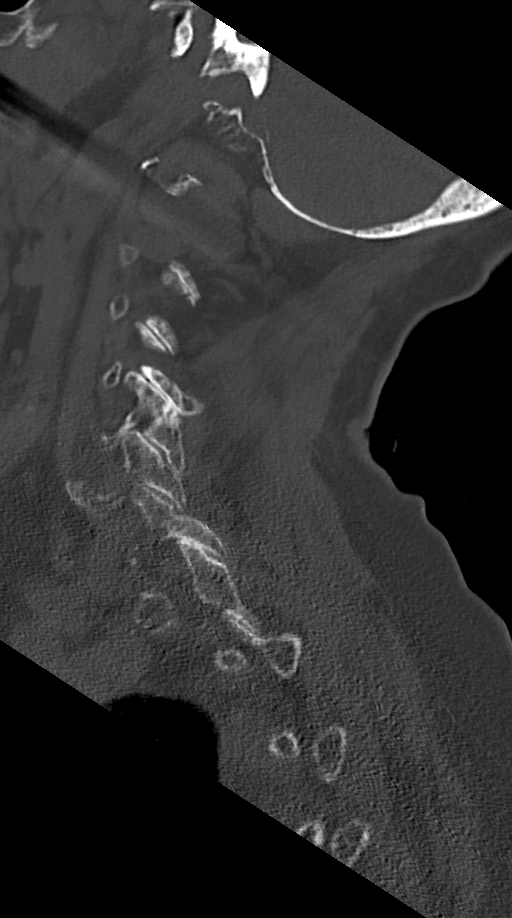
[im 51/61  bone]
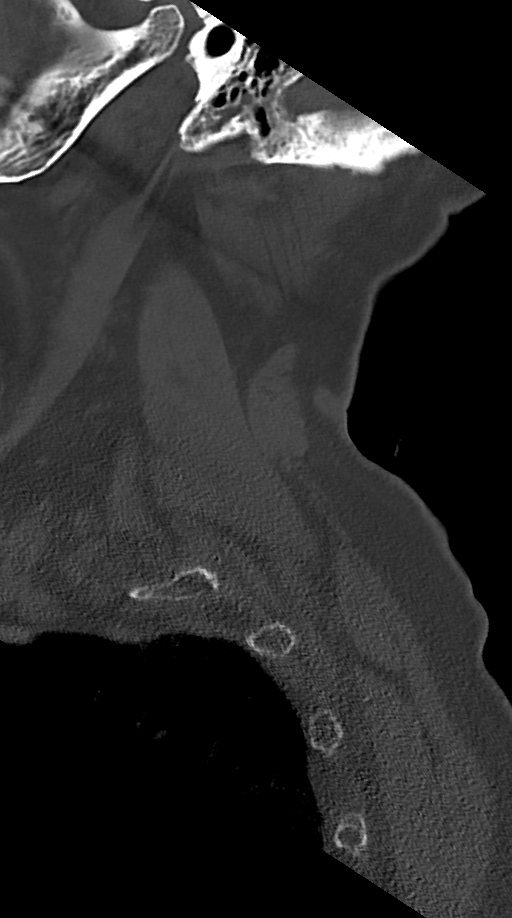

[Series 12: c_spine 2.0 orthogonals · axial · 0.21mm/px · z∈[-216,-172]mm · 2 of 74 slices shown]
[im 25/74  bone]
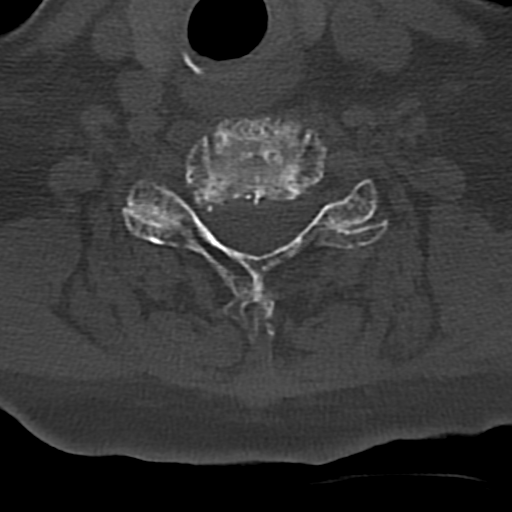
[im 49/74  bone]
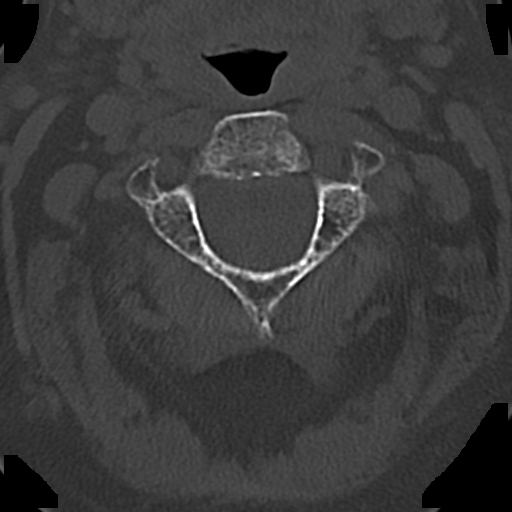

[14 of 33 positions shown; findings below may reference images not displayed]

FINDINGS: CT HEAD FINDINGS

Brain: No evidence of acute infarction, hemorrhage, hydrocephalus,
extra-axial collection or mass lesion/mass effect. Periventricular
white matter hypodensity.

Vascular: No hyperdense vessel or unexpected calcification.

Skull: There is a very heterogeneous calvarium with multiple tiny
lytic appearing lesions.

Sinuses/Orbits: No acute finding.

Other: None.

CT CERVICAL SPINE FINDINGS

Alignment: Normal.

Skull base and vertebrae: No acute fracture. Profound osteopenia. No
evidence focal lesion.

Soft tissues and spinal canal: No prevertebral fluid or swelling. No
visible canal hematoma.

Disc levels:  Mild multilevel disc space height loss.

Upper chest: Negative.

Other: None.
IMPRESSION: 1.  No acute intracranial pathology.

2.  Small-vessel white matter disease.

3. No fracture or static subluxation of the cervical spine. Profound
osteopenia somewhat limits evaluation. MRI may be helpful to
evaluate for cervical or other fracture if suspected given degree of
osteopenia.

4. Profound osteopenia of the cervical spine as noted above and a
very heterogeneous appearing calvarium with multiple tiny lytic
appearing lesions. This may be related to severe resorptive
osteopenia or underlying osseous abnormality including
myeloproliferative disorder. Correlate with clinical history and
metabolic status.
